# Patient Record
Sex: Male | Born: 1950
Health system: Southern US, Community
[De-identification: ages and names within clinical notes are randomized; demographics above are authoritative.]

## PROBLEM LIST (undated history)

## (undated) DIAGNOSIS — I10 Essential (primary) hypertension: Secondary | ICD-10-CM

## (undated) DIAGNOSIS — L4050 Arthropathic psoriasis, unspecified: Secondary | ICD-10-CM

## (undated) HISTORY — PX: TOTAL HIP ARTHROPLASTY: SHX124

## (undated) HISTORY — PX: HERNIA REPAIR: SHX51

## (undated) HISTORY — PX: APPENDECTOMY: SHX54

---

## 2013-12-16 LAB — HEPATIC FUNCTION PANEL
ALK PHOS: 42 U/L (ref 25–125)
ALT: 18 U/L (ref 10–40)

## 2013-12-16 LAB — CBC AND DIFFERENTIAL
Hemoglobin: 15.9 g/dL (ref 13.5–17.5)
PLATELETS: 211 10*3/uL (ref 150–399)
WBC: 8.9 10*3/mL

## 2013-12-16 LAB — BASIC METABOLIC PANEL
Creatinine: 0.9 mg/dL (ref 0.6–1.3)
GLUCOSE: 107 mg/dL
POTASSIUM: 4.7 mmol/L (ref 3.4–5.3)
SODIUM: 131 mmol/L — AB (ref 137–147)

## 2013-12-16 LAB — PSA: PSA: 0.3

## 2014-11-17 ENCOUNTER — Encounter: Payer: Self-pay | Admitting: *Deleted

## 2014-11-17 ENCOUNTER — Emergency Department
Admission: EM | Admit: 2014-11-17 | Discharge: 2014-11-17 | Disposition: A | Discharge status: Home / Self Care | Payer: BLUE CROSS/BLUE SHIELD | Source: Home / Self Care | Attending: Emergency Medicine | Admitting: Emergency Medicine

## 2014-11-17 ENCOUNTER — Emergency Department (INDEPENDENT_AMBULATORY_CARE_PROVIDER_SITE_OTHER): Payer: BLUE CROSS/BLUE SHIELD

## 2014-11-17 DIAGNOSIS — M25562 Pain in left knee: Secondary | ICD-10-CM | POA: Diagnosis not present

## 2014-11-17 DIAGNOSIS — I7389 Other specified peripheral vascular diseases: Secondary | ICD-10-CM | POA: Diagnosis not present

## 2014-11-17 DIAGNOSIS — R52 Pain, unspecified: Secondary | ICD-10-CM

## 2014-11-17 DIAGNOSIS — S8392XA Sprain of unspecified site of left knee, initial encounter: Secondary | ICD-10-CM

## 2014-11-17 HISTORY — DX: Essential (primary) hypertension: I10

## 2014-11-17 HISTORY — DX: Arthropathic psoriasis, unspecified: L40.50

## 2014-11-17 NOTE — ED Provider Notes (Signed)
CSN: [id number]     Arrival date & time 11/17/14  1003 History   First MD Initiated Contact with Patient 11/17/14 1021     Chief Complaint  Patient presents with  . Knee Pain   (Consider location/radiation/quality/duration/timing/severity/associated sxs/prior Treatment) Patient is a 64 y.o. male presenting with knee pain. The history is provided by the patient. No language interpreter was used.  Knee Pain Location:  Knee Time since incident:  3 days Pain details:    Quality:  Aching   Radiates to:  Does not radiate   Severity:  Moderate   Onset quality:  Gradual   Duration:  3 days   Timing:  Constant   Progression:  Worsening Chronicity:  New Prior injury to area:  Yes Relieved by:  Nothing Worsened by:  Nothing tried Ineffective treatments:  None tried Risk factors: no concern for non-accidental trauma   Pt twisted his knee going down a step 3 days ago.  Pt reports had pain in knee for a week before   Past Medical History  Diagnosis Date  . Hypertension   . Psoriatic arthritis    Past Surgical History  Procedure Laterality Date  . Total hip arthroplasty    . Appendectomy     History reviewed. No pertinent family history. History  Substance Use Topics  . Smoking status: Current Every Day Smoker -- 1.50 packs/day  . Smokeless tobacco: Not on file  . Alcohol Use: Yes    Review of Systems  All other systems reviewed and are negative.   Allergies  Methotrexate derivatives; Penicillins; and Zocor  Home Medications   Prior to Admission medications   Medication Sig Start Date End Date Taking? Authorizing Provider  amLODipine-benazepril (LOTREL) 5-10 MG per capsule Take 1 capsule by mouth daily.   Yes Historical Provider, MD  aspirin 81 MG tablet Take 81 mg by mouth daily.   Yes Historical Provider, MD  inFLIXimab (REMICADE) 100 MG injection Inject into the vein.   Yes Historical Provider, MD  metoprolol succinate (TOPROL-XL) 100 MG 24 hr tablet Take 100 mg by  mouth daily. Take with or immediately following a meal.   Yes Historical Provider, MD  probenecid (BENEMID) 500 MG tablet Take 500 mg by mouth 2 (two) times daily.   Yes Historical Provider, MD   BP 122/77 mmHg  Pulse 68  Temp(Src) 98.4 F (36.9 C) (Oral)  Resp 18  Ht  (1.854 m)  Wt 199 lb (90.266 kg)  BMI 26.26 kg/m2  SpO2 97% Physical Exam  Constitutional: He appears well-developed and well-nourished.  HENT:  Head: Normocephalic.  Right Ear: External ear normal.  Left Ear: External ear normal.  Nose: Nose normal.  Mouth/Throat: Oropharynx is clear and moist.  Musculoskeletal: He exhibits tenderness.  Tender knee from,  Pain with movement,  nv and ns intact no medial or lateral instability  Neurological: He is alert.  Skin: Skin is warm.    ED Course  Procedures (including critical care time) Labs Review Labs Reviewed - No data to display  Imaging Review Dg Knee 1-2 Views Left  11/17/2014   CLINICAL DATA:  64 year old male with medial left knee pain, swelling, superior patella pain x1 week. Initial encounter.  EXAM: LEFT KNEE - 1-2 VIEW  COMPARISON:  None.  FINDINGS: Extensive calcified arterial plaque in the visible left lower extremity. Bone mineralization is within normal limits. Small suprapatellar joint effusion. The patella appears intact. Joint spaces are within normal limits for age. No acute fracture or  dislocation.  IMPRESSION: 1. Suggestion of small suprapatellar joint effusion but no acute osseous abnormality identified at the left knee. 2. Extensive calcified peripheral vascular disease.   Electronically Signed   By: Augusto GambleLee  Hall M.D.   On: 11/17/2014 10:40     MDM   1. Knee sprain, left, initial encounter   2. Pain    avs See your Physician for recheck in 1 week Ibuprofen  Knee imbolizer   Elson AreasLeslie K Desha Bitner, PA-C 11/17/14 1201

## 2014-11-17 NOTE — Discharge Instructions (Signed)

## 2014-11-17 NOTE — ED Notes (Signed)
Pt c/o LT knee pain x 1 wk, worse x 3 days after twisting it. He has taken Advil for pain.

## 2015-02-08 ENCOUNTER — Ambulatory Visit (INDEPENDENT_AMBULATORY_CARE_PROVIDER_SITE_OTHER): Payer: BLUE CROSS/BLUE SHIELD | Admitting: Family Medicine

## 2015-02-08 ENCOUNTER — Encounter: Payer: Self-pay | Admitting: Family Medicine

## 2015-02-08 VITALS — BP 125/80 | HR 75 | Ht 73.0 in | Wt 196.0 lb

## 2015-02-08 DIAGNOSIS — I10 Essential (primary) hypertension: Secondary | ICD-10-CM

## 2015-02-08 DIAGNOSIS — E785 Hyperlipidemia, unspecified: Secondary | ICD-10-CM | POA: Diagnosis not present

## 2015-02-08 DIAGNOSIS — L4050 Arthropathic psoriasis, unspecified: Secondary | ICD-10-CM | POA: Diagnosis not present

## 2015-02-08 MED ORDER — AMLODIPINE BESY-BENAZEPRIL HCL 5-10 MG PO CAPS: 1.0000 | ORAL_CAPSULE | Freq: Every day | ORAL | Status: DC

## 2015-02-08 MED ORDER — FISH OIL 1000 MG PO CAPS: ORAL_CAPSULE | ORAL | Status: DC

## 2015-02-08 MED ORDER — METOPROLOL SUCCINATE ER 100 MG PO TB24: 100.0000 mg | ORAL_TABLET | Freq: Every day | ORAL | Status: DC

## 2015-02-08 NOTE — Progress Notes (Signed)
CC: Bryan PayorMark Benson is a 64 y.o. male is here for Establish Care and refill Bp med   Subjective: HPI:  Very pleasant 64 year old here to establish care  Reports ahistory of hypertension stemming back the past 12 years. For at least the past year he's been taking amlodipine/benazepril along with metoprolol on a daily basis. On chart review blood pressures have been stable with this regimen at other clinics. He denies any known cardiovascular disease. He is requesting refills on his medications. He denies no side effects from these medications. No other outside blood pressures to report  He reports a history of hyperlipidemia. Last lipid panel was 2014 with an LDL of 152.  He has tried both Lipitor and Zocor in the past that caused intolerable myalgias. He has never tried anything else to help with cholesterol. No formal exercise routine.  Reports a history of psoriatic arthritis. Many years ago he was started on probenecid due to concerns that he had gout. There was never any clear consensus on whether or not his pain was due to gout or psoriatic arthritis or he's been able to taper down on probenecid now taking only once a day. He wants to stop taking this entirely.   Review Of Systems Outlined In HPI  Past Medical History  Diagnosis Date  . Hypertension   . Psoriatic arthritis     Past Surgical History  Procedure Laterality Date  . Total hip arthroplasty  [id number]  . Appendectomy      age 89  . Hernia repair  [phone]   Family History  Problem Relation Age of Onset  . Stroke Father     History   Social History  . Marital Status: Married    Spouse Name: N/A  . Number of Children: N/A  . Years of Education: N/A   Occupational History  . Not on file.   Social History Main Topics  . Smoking status: Current Every Day Smoker -- 1.50 packs/day for 48 years  . Smokeless tobacco: Not on file  . Alcohol Use: 4.2 oz/week    7 Standard drinks or equivalent per week  . Drug Use:  No  . Sexual Activity:    Partners: Female   Other Topics Concern  . Not on file   Social History Narrative     Objective: BP 125/80 mmHg  Pulse 75  Ht 6\' 1"  (1.854 m)  Wt 196 lb (88.905 kg)  BMI 25.86 kg/m2  General: Alert and Oriented, No Acute Distress HEENT: Pupils equal, round, reactive to light. Conjunctivae clear.  Moist mucous membranes parents unremarkable Lungs: Clear to auscultation bilaterally, no wheezing/ronchi/rales.  Comfortable work of breathing. Good air movement. Cardiac: Regular rate and rhythm. Normal S1/S2.  No murmurs, rubs, nor gallops.  No carotid bruit Extremities: No peripheral edema.  Strong peripheral pulses.  Mental Status: No depression, anxiety, nor agitation. Skin: Warm and dry.  Assessment & Plan: Bryan Benson was seen today for establish care and refill bp med.  Diagnoses and all orders for this visit:  Essential hypertension  Hyperlipidemia  Psoriatic arthritis  Other orders -     amLODipine-benazepril (LOTREL) 5-10 MG per capsule; Take 1 capsule by mouth daily. -     metoprolol succinate (TOPROL-XL) 100 MG 24 hr tablet; Take 1 tablet (100 mg total) by mouth daily. Take with or immediately following a meal. -     Omega-3 Fatty Acids (FISH OIL) 1000 MG CAPS; 1 by mouth twice a day with meals   Essential hypertension:  Controlled continue amlodipine, benazepril and metoprolol.I've asked him to return at his convenience to have a fasting metabolic panel to check renal function and blood sugar Hyperlipidemia: Intolerant of statins, starting twice a day fish oil. Return in approximately 3 months to have cholesterol checked. Psoriatic arthritis: I've asked him to continue taking probenecid as previously prescribed by his rheumatologist, I more if he stopped taking probenecid under my guidance and if he had a gout flare it could cause permanent pain given his history of psoriatic arthritis.  We talked about getting a colonoscopy today, his father had  a perforation when he was scoped many years ago and the patient has reservations about getting a colonoscopy due to this risk. We discussed risks and benefits of this procedure and I would get it if I were him however he politely declines.   Return in about 3 months (around 05/11/2015) for CPE.

## 2015-03-04 ENCOUNTER — Encounter: Payer: Self-pay | Admitting: Family Medicine

## 2015-03-04 DIAGNOSIS — R809 Proteinuria, unspecified: Secondary | ICD-10-CM | POA: Insufficient documentation

## 2015-03-04 DIAGNOSIS — M109 Gout, unspecified: Secondary | ICD-10-CM | POA: Insufficient documentation

## 2015-03-16 ENCOUNTER — Encounter: Payer: Self-pay | Admitting: Family Medicine

## 2015-05-11 ENCOUNTER — Encounter: Payer: Self-pay | Admitting: Family Medicine

## 2015-05-11 ENCOUNTER — Ambulatory Visit (INDEPENDENT_AMBULATORY_CARE_PROVIDER_SITE_OTHER): Payer: BLUE CROSS/BLUE SHIELD | Admitting: Family Medicine

## 2015-05-11 VITALS — BP 134/85 | HR 64 | Ht 73.0 in | Wt 193.0 lb

## 2015-05-11 DIAGNOSIS — Z0000 Encounter for general adult medical examination without abnormal findings: Secondary | ICD-10-CM

## 2015-05-11 DIAGNOSIS — L989 Disorder of the skin and subcutaneous tissue, unspecified: Secondary | ICD-10-CM | POA: Diagnosis not present

## 2015-05-11 LAB — LIPID PANEL
Cholesterol: 295 mg/dL — ABNORMAL HIGH (ref 0–200)
HDL: 53 mg/dL (ref >=40)
LDL Cholesterol: 199 mg/dL — ABNORMAL HIGH (ref 0–99)
Total CHOL/HDL Ratio: 5.6 Ratio
Triglycerides: 216 mg/dL — ABNORMAL HIGH (ref <150)
VLDL: 43 mg/dL — ABNORMAL HIGH (ref 0–40)

## 2015-05-11 MED ORDER — METOPROLOL TARTRATE 100 MG PO TABS: 100.0000 mg | ORAL_TABLET | Freq: Every day | ORAL | Status: DC

## 2015-05-11 MED ORDER — AMLODIPINE BESY-BENAZEPRIL HCL 5-10 MG PO CAPS: 1.0000 | ORAL_CAPSULE | Freq: Every day | ORAL | Status: DC

## 2015-05-11 NOTE — Patient Instructions (Signed)
Dr. Faithlynn Deeley's General Advice Following Your Complete Physical Exam  The Benefits of Regular Exercise: Unless you suffer from an uncontrolled cardiovascular condition, studies strongly suggest that regular exercise and physical activity will add to both the quality and length of your life.  The World Health Organization recommends 150 minutes of moderate intensity aerobic activity every week.  This is best split over 3-4 days a week, and can be as simple as a brisk walk for just over 35 minutes "most days of the week".  This type of exercise has been shown to lower LDL-Cholesterol, lower average blood sugars, lower blood pressure, lower cardiovascular disease risk, improve memory, and increase one's overall sense of wellbeing.  The addition of anaerobic (or "strength training") exercises offers additional benefits including but not limited to increased metabolism, prevention of osteoporosis, and improved overall cholesterol levels.  How Can I Strive For A Low-Fat Diet?: Current guidelines recommend that 25-35 percent of your daily energy (food) intake should come from fats.  One might ask how can this be achieved without having to dissect each meal on a daily basis?  Switch to skim or 1% milk instead of whole milk.  Focus on lean meats such as ground turkey, fresh fish, baked chicken, and lean cuts of beef as your source of dietary protein.  Limit saturated fat consumption to less than 10% of your daily caloric intake.  Limit trans fatty acid consumption primarily by limiting synthetic trans fats such as partially hydrogenated oils (Ex: fried fast foods).  Substitute olive or vegetable oil for solid fats where possible.  Moderation of Salt Intake: Provided you don't carry a diagnosis of congestive heart failure nor renal failure, I recommend a daily allowance of no more than 2300 mg of salt (sodium).  Keeping under this daily goal is associated with a decreased risk of cardiovascular events, creeping  above it can lead to elevated blood pressures and increases your risk of cardiovascular events.  Milligrams (mg) of salt is listed on all nutrition labels, and your daily intake can add up faster than you think.  Most canned and frozen dinners can pack in over half your daily salt allowance in one meal.    Lifestyle Health Risks: Certain lifestyle choices carry specific health risks.  As you may already know, tobacco use has been associated with increasing one's risk of cardiovascular disease, pulmonary disease, numerous cancers, among many other issues.  What you may not know is that there are medications and nicotine replacement strategies that can more than double your chances of successfully quitting.  I would be thrilled to help manage your quitting strategy if you currently use tobacco products.  When it comes to alcohol use, I've yet to find an "ideal" daily allowance.  Provided an individual does not have a medical condition that is exacerbated by alcohol consumption, general guidelines determine "safe drinking" as no more than two standard drinks for a man or no more than one standard drink for a male per day.  However, much debate still exists on whether any amount of alcohol consumption is technically "safe".  My general advice, keep alcohol consumption to a minimum for general health promotion.  If you or others believe that alcohol, tobacco, or recreational drug use is interfering with your life, I would be happy to provide confidential counseling regarding treatment options.  General "Over The Counter" Nutrition Advice: Postmenopausal women should aim for a daily calcium intake of 1200 mg, however a significant portion of this might already be   provided by diets including milk, yogurt, cheese, and other dairy products.  Vitamin D has been shown to help preserve bone density, prevent fatigue, and has even been shown to help reduce falls in the elderly.  Ensuring a daily intake of 800 Units of  Vitamin D is a good place to start to enjoy the above benefits, we can easily check your Vitamin D level to see if you'd potentially benefit from supplementation beyond 800 Units a day.  Folic Acid intake should be of particular concern to women of childbearing age.  Daily consumption of 400-800 mcg of Folic Acid is recommended to minimize the chance of spinal cord defects in a fetus should pregnancy occur.    For many adults, accidents still remain one of the most common culprits when it comes to cause of death.  Some of the simplest but most effective preventitive habits you can adopt include regular seatbelt use, proper helmet use, securing firearms, and regularly testing your smoke and carbon monoxide detectors.  Norissa Bartee B. Tejay Hubert DO [facility] [address], [address] [place], [place] [address] Phone: [phone]  

## 2015-05-11 NOTE — Progress Notes (Signed)
CC: Bryan Benson is a 64 y.o. male is here for Annual Exam   Subjective: HPI:  Colonoscopy: Patient declines all screening for colon cancer Prostate: Discussed screening risks/beneifts with patient during today's visit, he would be okay with getting a PSA  Influenza Vaccine: No current indication Pneumovax: Declined Td/Tdap: UTD 2009 Zoster: Contraindicated given Remicade infusions  Presents for complete physical exam complete metabolic panel unremarkable this month and CBC unremarkable other than mild elevation of MCV earlier this month  Review of Systems - General ROS: negative for - chills, fever, night sweats, weight gain or weight loss Ophthalmic ROS: negative for - decreased vision Psychological ROS: negative for - anxiety or depression ENT ROS: negative for - hearing change, nasal congestion, tinnitus or allergies Hematological and Lymphatic ROS: negative for - bleeding problems, bruising or swollen lymph nodes Breast ROS: negative Respiratory ROS: no cough, shortness of breath, or wheezing Cardiovascular ROS: no chest pain or dyspnea on exertion Gastrointestinal ROS: no abdominal pain, change in bowel habits, or black or bloody stools Genito-Urinary ROS: negative for - genital discharge, genital ulcers, incontinence or abnormal bleeding from genitals Musculoskeletal ROS: negative for - joint pain or muscle pain Neurological ROS: negative for - headaches or memory loss Dermatological ROS: negative for lumps, mole changes, rash and skin lesion changes  Past Medical History  Diagnosis Date  . Hypertension   . Psoriatic arthritis     Past Surgical History  Procedure Laterality Date  . Total hip arthroplasty  0981,1914  . Appendectomy      age 47  . Hernia repair  [phone]   Family History  Problem Relation Age of Onset  . Stroke Father     History   Social History  . Marital Status: Married    Spouse Name: N/A  . Number of Children: N/A  . Years of Education:  N/A   Occupational History  . Not on file.   Social History Main Topics  . Smoking status: Current Every Day Smoker -- 1.50 packs/day for 48 years  . Smokeless tobacco: Not on file  . Alcohol Use: 4.2 oz/week    7 Standard drinks or equivalent per week  . Drug Use: No  . Sexual Activity:    Partners: Female   Other Topics Concern  . Not on file   Social History Narrative     Objective: BP 134/85 mmHg  Pulse 64  Ht  (1.854 m)  Wt 193 lb (87.544 kg)  BMI 25.47 kg/m2  General: No Acute Distress HEENT: Atraumatic, normocephalic, conjunctivae normal without scleral icterus.  No nasal discharge, hearing grossly intact, TMs with good landmarks bilaterally with no middle ear abnormalities, posterior pharynx clear without oral lesions. Neck: Supple, trachea midline, no cervical nor supraclavicular adenopathy. Pulmonary: Clear to auscultation bilaterally without wheezing, rhonchi, nor rales. Cardiac: Regular rate and rhythm.  No murmurs, rubs, nor gallops. No peripheral edema.  2+ peripheral pulses bilaterally. Abdomen: Bowel sounds normal.  No masses.  Non-tender without rebound.  Negative Murphy's sign. GU: Bilateral descended testes no inguinal hernia MSK: Grossly intact, no signs of weakness.  Full strength throughout upper and lower extremities.  Full ROM in upper and lower extremities.  No midline spinal tenderness. Neuro: Gait unremarkable, CN II-XII grossly intact.  C5-C6 Reflex 2/4 Bilaterally, L4 Reflex 2/4 Bilaterally.  Cerebellar function intact. Skin: No rashes. Pearly and flushing dome-shaped raised lesion on the right cheek Psych: Alert and oriented to person/place/time.  Thought process normal. No anxiety/depression.  Assessment &  Plan: Bryan LericheMark was seen today for annual exam.  Diagnoses and all orders for this visit:  Annual physical exam Orders: -     PSA -     Lipid panel  Facial lesion Orders: -     Ambulatory referral to Dermatology  Other orders -      amLODipine-benazepril (LOTREL) 5-10 MG per capsule; Take 1 capsule by mouth daily. -     metoprolol (LOPRESSOR) 100 MG tablet; Take 1 tablet (100 mg total) by mouth daily.   Healthy lifestyle interventions including but not limited to regular exercise, a healthy low fat diet, moderation of salt intake, the dangers of tobacco/alcohol/recreational drug use, nutrition supplementation, and accident avoidance were discussed with the patient and a handout was provided for future reference. Discussed my suspicion for basal cell carcinoma on the right cheek and referral to dermatology for further evaluation.  Return in about 6 months (around 11/10/2015) for Blood Pressure.

## 2015-05-12 ENCOUNTER — Telehealth: Payer: Self-pay | Admitting: Family Medicine

## 2015-05-12 DIAGNOSIS — E785 Hyperlipidemia, unspecified: Secondary | ICD-10-CM

## 2015-05-12 LAB — PSA: PSA: 0.34 ng/mL (ref <=4.00)

## 2015-05-12 NOTE — Telephone Encounter (Signed)
Sue Lushndrea, Will you please let patient know that his LDL cholesterol was two times above the upper limit of normal.  Since is has an intolerance to some of the statin class of cholesterol lowering medications I'd recommend trying an OTC formulation of red yeast rice at a dose of 1200mg  daily.  I'd also recommend he return in 3 months to have cholesterol rechecked.  Fortunately the PSA prostate test was normal.

## 2015-05-12 NOTE — Telephone Encounter (Signed)
Left message on vm

## 2015-06-15 ENCOUNTER — Other Ambulatory Visit: Payer: Self-pay | Admitting: *Deleted

## 2015-06-15 MED ORDER — METOPROLOL SUCCINATE ER 100 MG PO TB24: 100.0000 mg | ORAL_TABLET | Freq: Every day | ORAL | Status: DC

## 2015-08-11 ENCOUNTER — Ambulatory Visit (INDEPENDENT_AMBULATORY_CARE_PROVIDER_SITE_OTHER): Payer: BLUE CROSS/BLUE SHIELD | Admitting: Family Medicine

## 2015-08-11 ENCOUNTER — Encounter: Payer: Self-pay | Admitting: Family Medicine

## 2015-08-11 VITALS — BP 130/77 | HR 74 | Wt 200.0 lb

## 2015-08-11 DIAGNOSIS — E785 Hyperlipidemia, unspecified: Secondary | ICD-10-CM

## 2015-08-11 DIAGNOSIS — I10 Essential (primary) hypertension: Secondary | ICD-10-CM

## 2015-08-11 LAB — LIPID PANEL
CHOL/HDL RATIO: 6 ratio — AB (ref <=5.0)
Cholesterol: 270 mg/dL — ABNORMAL HIGH (ref 125–200)
HDL: 45 mg/dL (ref >=40)
LDL Cholesterol: 187 mg/dL — ABNORMAL HIGH (ref <130)
Triglycerides: 189 mg/dL — ABNORMAL HIGH (ref <150)
VLDL: 38 mg/dL — AB (ref <30)

## 2015-08-11 NOTE — Progress Notes (Signed)
CC: Bryan Benson is a 64 y.o. male is here for f/u chlesterol   Subjective: HPI:  Hyper lipidemia: Continues to take red yeast rice on a daily basis. No known side effects. Denies any myalgias right upper quadrant pain or joint pain. He's been intolerant of all other statins.  Follow-up essential hypertension: He is actually not taking metoprolol tartrate. He is taking metoprolol succinate on a daily basis with no outside blood pressures to report. He denies chest pain shortness of breath orthopnea nor peripheral edema.  Additionally is taking Lotrel as prescribed.    Review Of Systems Outlined In HPI  Past Medical History  Diagnosis Date  . Hypertension   . Psoriatic arthritis     Past Surgical History  Procedure Laterality Date  . Total hip arthroplasty  1610,9604  . Appendectomy      age 71  . Hernia repair  [phone]   Family History  Problem Relation Age of Onset  . Stroke Father     Social History   Social History  . Marital Status: Married    Spouse Name: N/A  . Number of Children: N/A  . Years of Education: N/A   Occupational History  . Not on file.   Social History Main Topics  . Smoking status: Current Every Day Smoker -- 1.50 packs/day for 48 years  . Smokeless tobacco: Not on file  . Alcohol Use: 4.2 oz/week    7 Standard drinks or equivalent per week  . Drug Use: No  . Sexual Activity:    Partners: Female   Other Topics Concern  . Not on file   Social History Narrative     Objective: BP 130/77 mmHg  Pulse 74  Wt 200 lb (90.719 kg)  General: Alert and Oriented, No Acute Distress HEENT: Pupils equal, round, reactive to light. Conjunctivae clear.  Moist mucous membranes Lungs: Clear to auscultation bilaterally, no wheezing/ronchi/rales.  Comfortable work of breathing. Good air movement. Cardiac: Regular rate and rhythm. Normal S1/S2.  No murmurs, rubs, nor gallops.   Abdomen: Mild obesity Extremities: No peripheral edema.  Strong peripheral  pulses.  Mental Status: No depression, anxiety, nor agitation. Skin: Warm and dry.  Assessment & Plan: Spero was seen today for f/u chlesterol.  Diagnoses and all orders for this visit:  Essential hypertension  Hyperlipidemia -     Lipid panel    essential hypertension: Controlled continue Lotrel and metoprolol   hyperlipidemia: Tolerant of red yeast rice, continue to take daily pending lipid panel today.  Declines flu shot  Return if symptoms worsen or fail to improve.

## 2015-08-12 ENCOUNTER — Telehealth: Payer: Self-pay | Admitting: Family Medicine

## 2015-08-12 MED ORDER — PITAVASTATIN CALCIUM 2 MG PO TABS: 2.0000 mg | ORAL_TABLET | Freq: Every day | ORAL | Status: DC

## 2015-08-12 NOTE — Telephone Encounter (Signed)
Sue Lush, Will you please let patient know that his LDL cholesterol has only improved by a few points and still remains significantly elevated.  There is a new type of pharmaceutical grade cholesterol medication on the market known as Livalo.  It's metabolized different than the other types he's tried and is extremely less likely to cause side effects.  I'd recommend he give this a try but first swing by the office to pick up a savings voucher from the triage room. Rx was sent to [facility].  If tolerated please return in three months to recheck cholesterol.

## 2015-08-12 NOTE — Telephone Encounter (Signed)
Message left on cellvm with results

## 2015-09-15 ENCOUNTER — Telehealth: Payer: Self-pay | Admitting: Family Medicine

## 2015-09-15 NOTE — Telephone Encounter (Signed)
Received fax for prior authorization on Livalo 2mg  sent through cover my meds waiting on authorization. - CF

## 2015-09-24 NOTE — Telephone Encounter (Signed)
Medication was approved. - CF

## 2015-11-02 ENCOUNTER — Encounter: Payer: Self-pay | Admitting: Family Medicine

## 2015-11-02 ENCOUNTER — Ambulatory Visit (INDEPENDENT_AMBULATORY_CARE_PROVIDER_SITE_OTHER): Payer: BLUE CROSS/BLUE SHIELD | Admitting: Family Medicine

## 2015-11-02 VITALS — BP 140/81 | HR 63 | Wt 196.0 lb

## 2015-11-02 DIAGNOSIS — E785 Hyperlipidemia, unspecified: Secondary | ICD-10-CM

## 2015-11-02 DIAGNOSIS — I10 Essential (primary) hypertension: Secondary | ICD-10-CM

## 2015-11-02 MED ORDER — METOPROLOL SUCCINATE ER 100 MG PO TB24: 100.0000 mg | ORAL_TABLET | Freq: Every day | ORAL | Status: DC

## 2015-11-02 MED ORDER — AMLODIPINE BESY-BENAZEPRIL HCL 5-10 MG PO CAPS
1.0000 | ORAL_CAPSULE | Freq: Every day | ORAL | Status: DC
Start: 2015-11-02 — End: 2016-06-05

## 2015-11-02 MED ORDER — PITAVASTATIN CALCIUM 2 MG PO TABS: 2.0000 mg | ORAL_TABLET | Freq: Every day | ORAL | Status: DC

## 2015-11-02 NOTE — Progress Notes (Signed)
CC: Rubin PayorMark Hiltz is a 64 y.o. male is here for Hypertension and Hyperlipidemia   Subjective: HPI:  Livalo needs another PA, since starting this medication he denies any known side effects.  Denies RUQ pain nor myalgias.  No chest pain or limb claudication.   F/U HTN; continues on metoprolol and Lotrel.  No outside BPs to report.  No chest pain, SOB, orthopnea, nor peripheral edema.     Review Of Systems Outlined In HPI  Past Medical History  Diagnosis Date  . Hypertension   . Psoriatic arthritis     Past Surgical History  Procedure Laterality Date  . Total hip arthroplasty  [id number],2006  . Appendectomy      age 89  . Hernia repair  [phone]   Family History  Problem Relation Age of Onset  . Stroke Father     Social History   Social History  . Marital Status: Married    Spouse Name: N/A  . Number of Children: N/A  . Years of Education: N/A   Occupational History  . Not on file.   Social History Main Topics  . Smoking status: Current Every Day Smoker -- 1.50 packs/day for 48 years  . Smokeless tobacco: Not on file  . Alcohol Use: 4.2 oz/week    7 Standard drinks or equivalent per week  . Drug Use: No  . Sexual Activity:    Partners: Female   Other Topics Concern  . Not on file   Social History Narrative     Objective: BP 140/81 mmHg  Pulse 63  Wt 196 lb (88.905 kg)  General: Alert and Oriented, No Acute Distress HEENT: Pupils equal, round, reactive to light. Conjunctivae clear.  Moist mucous membranes Lungs: Clear to auscultation bilaterally, no wheezing/ronchi/rales.  Comfortable work of breathing. Good air movement. Cardiac: Regular rate and rhythm. Normal S1/S2.  No murmurs, rubs, nor gallops.   Extremities: No peripheral edema.  Strong peripheral pulses.  Mental Status: No depression, anxiety, nor agitation. Skin: Warm and dry.  Assessment & Plan: [name] was seen today for hypertension and hyperlipidemia.  Diagnoses and all orders for this  visit:  Essential hypertension -     amLODipine-benazepril (LOTREL) 5-10 MG capsule; Take 1 capsule by mouth daily. -     metoprolol succinate (TOPROL-XL) 100 MG 24 hr tablet; Take 1 tablet (100 mg total) by mouth daily. Take with or immediately following a meal.  Hyperlipidemia -     Lipid panel  Other orders -     Pitavastatin Calcium (LIVALO) 2 MG TABS; Take 1 tablet (2 mg total) by mouth daily.   Essential hypertension: Controlled continue Lotrel and metoprolol Hyperlipidemia: Clinically controlled and tolerating livalo, a new prior authorization will be submitted, continue 2 mg daily pending lipid panel that was ordered today.  Return in about 3 months (around 01/31/2016).

## 2015-11-05 ENCOUNTER — Encounter: Payer: Self-pay | Admitting: Family Medicine

## 2015-11-05 LAB — LIPID PANEL
CHOL/HDL RATIO: 3.6 ratio (ref <=5.0)
CHOLESTEROL: 192 mg/dL (ref 125–200)
HDL: 54 mg/dL (ref >=40)
LDL CALC: 108 mg/dL (ref <130)
Triglycerides: 151 mg/dL — ABNORMAL HIGH (ref <150)
VLDL: 30 mg/dL (ref <30)

## 2015-11-10 ENCOUNTER — Ambulatory Visit: Payer: BLUE CROSS/BLUE SHIELD | Admitting: Family Medicine

## 2015-12-07 ENCOUNTER — Telehealth: Payer: Self-pay

## 2015-12-07 NOTE — Telephone Encounter (Signed)
Bryan Benson called and asked if he needs antibiotic before dental procedure because he has had a hip replacement. Please advise.

## 2015-12-08 NOTE — Telephone Encounter (Signed)
Not that I'm aware of given how far out his hip replacement was.

## 2015-12-10 NOTE — Telephone Encounter (Signed)
vm left for pt to return call

## 2016-02-16 ENCOUNTER — Ambulatory Visit (INDEPENDENT_AMBULATORY_CARE_PROVIDER_SITE_OTHER): Payer: BLUE CROSS/BLUE SHIELD | Admitting: Family Medicine

## 2016-02-16 ENCOUNTER — Encounter: Payer: Self-pay | Admitting: Family Medicine

## 2016-02-16 ENCOUNTER — Other Ambulatory Visit: Payer: Self-pay | Admitting: Family Medicine

## 2016-02-16 VITALS — BP 121/78 | HR 60 | Wt 196.0 lb

## 2016-02-16 DIAGNOSIS — I10 Essential (primary) hypertension: Secondary | ICD-10-CM | POA: Diagnosis not present

## 2016-02-16 DIAGNOSIS — E785 Hyperlipidemia, unspecified: Secondary | ICD-10-CM | POA: Diagnosis not present

## 2016-02-16 LAB — LDL CHOLESTEROL, DIRECT: LDL DIRECT: 110 mg/dL (ref <130)

## 2016-02-16 MED ORDER — PITAVASTATIN CALCIUM 2 MG PO TABS: 2.0000 mg | ORAL_TABLET | Freq: Every day | ORAL | Status: DC

## 2016-02-16 NOTE — Progress Notes (Signed)
CC: Bryan Benson is a 65 y.o. male is here for Hyperlipidemia   Subjective: HPI:  Follow-up essential hypertension: Taking metoprolol on a daily basis along with Lotrel. blood pressures reported. denies chest pain shortness of breath orthopnea nor peripheral edema.  Follow hyperlipidemia: Since starting livalo he's not had any side effects and actually gets his medication for free through his insurance. He denies any right upper quadrant pain or myalgias. Cholesterol was checked 3 months ago and was normal. He's tried his best to eat as healthy as he can. Denies leg claudication   Review Of Systems Outlined In HPI  Past Medical History  Diagnosis Date  . Hypertension   . Psoriatic arthritis [facility](HCC)     Past Surgical History  Procedure Laterality Date  . Total hip arthroplasty  [id number],2006  . Appendectomy      age 89  . Hernia repair  [phone],2004   Family History  Problem Relation Age of Onset  . Stroke Father     Social History   Social History  . Marital Status: Married    Spouse Name: N/A  . Number of Children: N/A  . Years of Education: N/A   Occupational History  . Not on file.   Social History Main Topics  . Smoking status: Current Every Day Smoker -- 1.50 packs/day for 48 years  . Smokeless tobacco: Not on file  . Alcohol Use: 4.2 oz/week    7 Standard drinks or equivalent per week  . Drug Use: No  . Sexual Activity:    Partners: Female   Other Topics Concern  . Not on file   Social History Narrative     Objective: BP 121/78 mmHg  Pulse 60  Wt 196 lb (88.905 kg)  General: Alert and Oriented, No Acute Distress HEENT: Pupils equal, round, reactive to light. Conjunctivae clear.  Moist nevus membranes Lungs: Clear to auscultation bilaterally, no wheezing/ronchi/rales.  Comfortable work of breathing. Good air movement. Cardiac: Regular rate and rhythm. Normal S1/S2.  No murmurs, rubs, nor gallops.   Extremities: No peripheral edema.  Strong peripheral  pulses.  Mental Status: No depression, anxiety, nor agitation. Skin: Warm and dry.  Assessment & Plan: [name] was seen today for hyperlipidemia.  Diagnoses and all orders for this visit:  Essential hypertension  Hyperlipidemia -     Pitavastatin Calcium (LIVALO) 2 MG TABS; Take 1 tablet (2 mg total) by mouth daily. -     Direct LDL   Essential hypertension: Controlled continue metoprolol and Lotrel Hyperlipidemia: Repeat LDL today and if normal he can follow-up in 6 months.  25 minutes spent face-to-face during visit today of which at least 50% was counseling or coordinating care regarding: 1. Essential hypertension   2. Hyperlipidemia      Return in about 6 months (around 08/17/2016) for BP.

## 2016-06-05 ENCOUNTER — Encounter: Payer: Self-pay | Admitting: Family Medicine

## 2016-06-05 ENCOUNTER — Ambulatory Visit (INDEPENDENT_AMBULATORY_CARE_PROVIDER_SITE_OTHER): Payer: BLUE CROSS/BLUE SHIELD | Admitting: Family Medicine

## 2016-06-05 VITALS — BP 127/85 | HR 93 | Wt 193.0 lb

## 2016-06-05 DIAGNOSIS — I10 Essential (primary) hypertension: Secondary | ICD-10-CM | POA: Diagnosis not present

## 2016-06-05 DIAGNOSIS — M109 Gout, unspecified: Secondary | ICD-10-CM

## 2016-06-05 DIAGNOSIS — E785 Hyperlipidemia, unspecified: Secondary | ICD-10-CM | POA: Diagnosis not present

## 2016-06-05 MED ORDER — PITAVASTATIN CALCIUM 2 MG PO TABS: 2.0000 mg | ORAL_TABLET | Freq: Every day | ORAL | 1 refills | Status: DC

## 2016-06-05 MED ORDER — AMLODIPINE BESY-BENAZEPRIL HCL 5-10 MG PO CAPS: 1.0000 | ORAL_CAPSULE | Freq: Every day | ORAL | 1 refills | Status: DC

## 2016-06-05 MED ORDER — METOPROLOL SUCCINATE ER 100 MG PO TB24: 100.0000 mg | ORAL_TABLET | Freq: Every day | ORAL | 1 refills | Status: DC

## 2016-06-05 NOTE — Progress Notes (Signed)
CC: Bryan Benson is a 65 y.o. male is here for Hypertension and Medication Refill (pt is wanting paper Rx)   Subjective: HPI:  Follow-up essential hypertension: Taking Lotrel and metoprolol with no chest pain shortness of breath orthopnea nor peripheral edema. He gets an infusion first psoriatic arthritis every 8 weeks and tells me that when his blood pressures checked at the infusion center it is always normotensive    follow-up hyperlipidemia: He still gets livalo for free. He denies right upper quadrant pain or myalgias. No chest pain or limb claudication.  Follow-up Gout: He tells me it's been greater than 1 year since his last flareup. He is currently managing this with dietary restrictions. Denies any joint pain or swelling.   Review Of Systems Outlined In HPI  Past Medical History:  Diagnosis Date  . Hypertension   . Psoriatic arthritis [facility])     Past Surgical History:  Procedure Laterality Date  . APPENDECTOMY     age 34  . HERNIA REPAIR  [id number]  . TOTAL HIP ARTHROPLASTY  [id number]   Family History  Problem Relation Age of Onset  . Stroke Father     Social History   Social History  . Marital status: Married    Spouse name: N/A  . Number of children: N/A  . Years of education: N/A   Occupational History  . Not on file.   Social History Main Topics  . Smoking status: Current Every Day Smoker    Packs/day: 1.50    Years: 48.00  . Smokeless tobacco: Not on file  . Alcohol use 4.2 oz/week    7 Standard drinks or equivalent per week  . Drug use: No  . Sexual activity: Yes    Partners: Female   Other Topics Concern  . Not on file   Social History Narrative  . No narrative on file     Objective: BP 127/85 (BP Location: Left Arm, Patient Position: Sitting, Cuff Size: Normal)   Pulse 93   Wt 193 lb (87.5 kg)   BMI 25.46 kg/m   General: Alert and Oriented, No Acute Distress HEENT: Pupils equal, round, reactive to light. Conjunctivae clear. Moist.  His membranes Lungs: Clear to auscultation bilaterally, no wheezing/ronchi/rales.  Comfortable work of breathing. Good air movement. Cardiac: Regular rate and rhythm. Normal S1/S2.  No murmurs, rubs, nor gallops.   Extremities: No peripheral edema.  Strong peripheral pulses.  Mental Status: No depression, anxiety, nor agitation. Skin: Warm and dry.  Assessment & Plan: Bryan Benson was seen today for hypertension and medication refill.  Diagnoses and all orders for this visit:  Gout without tophus, unspecified cause, unspecified chronicity, unspecified site  Essential hypertension -     amLODipine-benazepril (LOTREL) 5-10 MG capsule; Take 1 capsule by mouth daily. -     metoprolol succinate (TOPROL-XL) 100 MG 24 hr tablet; Take 1 tablet (100 mg total) by mouth daily. Take with or immediately following a meal.  Hyperlipidemia -     Pitavastatin Calcium (LIVALO) 2 MG TABS; Take 1 tablet (2 mg total) by mouth daily.   Gout: Asymptomatic, controlled with dietary restrictions. Essential hypertension:  Controlled with Lotrel and metoprolol   hyperlipidemia: Controlled with Livalo, he'll be due for repeat lipid panel in 3-6 months.  Discussed with this patient that I will be resigning from my position here with [facility] in September in order to stay with my family who will be moving to [facility]. I let him know about the providers  that are still accepting patients and I feel that this individual will be under great care if he/she stays here with [facility].   Return in about 6 months (around 12/06/2016) for BP and Cholesterol Check.

## 2016-07-04 ENCOUNTER — Emergency Department
Admission: EM | Admit: 2016-07-04 | Discharge: 2016-07-04 | Disposition: A | Discharge status: Home / Self Care | Payer: BLUE CROSS/BLUE SHIELD | Source: Home / Self Care | Attending: Family Medicine | Admitting: Family Medicine

## 2016-07-04 ENCOUNTER — Emergency Department (INDEPENDENT_AMBULATORY_CARE_PROVIDER_SITE_OTHER): Payer: BLUE CROSS/BLUE SHIELD

## 2016-07-04 ENCOUNTER — Encounter: Payer: Self-pay | Admitting: *Deleted

## 2016-07-04 DIAGNOSIS — W19XXXA Unspecified fall, initial encounter: Secondary | ICD-10-CM

## 2016-07-04 DIAGNOSIS — M25551 Pain in right hip: Secondary | ICD-10-CM

## 2016-07-04 DIAGNOSIS — M25552 Pain in left hip: Secondary | ICD-10-CM | POA: Diagnosis not present

## 2016-07-04 DIAGNOSIS — Z96643 Presence of artificial hip joint, bilateral: Secondary | ICD-10-CM | POA: Diagnosis not present

## 2016-07-04 NOTE — ED Triage Notes (Signed)
Pt c/o RT hip soreness post fall at home last night. He reports hx of RT hip replacement. He took Extra strength Tylenol at 0600 today.

## 2016-07-04 NOTE — ED Provider Notes (Signed)
CSN: [id number]     Arrival date & time 07/04/16  0945 History   First MD Initiated Contact with Patient 07/04/16 1000     Chief Complaint  Patient presents with  . Hip Pain   (Consider location/radiation/quality/duration/timing/severity/associated sxs/prior Treatment) HPI Bryan Benson is a 65 y.o. male presenting to UC with wife with c/o Right hip/buttock pain after tripping and falling last night in his garage, landing on his Right hip.  He has had soreness since then.  Pain is 2/10 at this time after taking Extra Strength Tylenol around 6AM this morning.  He reports having bilateral hip replacement in 2005 and 2006.  He does not typically use a walker or cane to ambulate but started using a walker he had at home today to help ambulate due to the pain.  Denies radiation of pain or numbness down Right leg. Denies neck or back pain. No LOC. He is not on blood thinners.    Past Medical History:  Diagnosis Date  . Hypertension   . Psoriatic arthritis [facility](HCC)    Past Surgical History:  Procedure Laterality Date  . APPENDECTOMY     age 89  . HERNIA REPAIR  [id number],2004  . TOTAL HIP ARTHROPLASTY  [id number],2006   Family History  Problem Relation Age of Onset  . Stroke Father    Social History  Substance Use Topics  . Smoking status: Current Every Day Smoker    Packs/day: 2.00    Years: 48.00  . Smokeless tobacco: Never Used  . Alcohol use 4.2 oz/week    7 Standard drinks or equivalent per week    Review of Systems  Cardiovascular: Negative for chest pain and palpitations.  Musculoskeletal: Positive for arthralgias, gait problem and myalgias. Negative for back pain, joint swelling, neck pain and neck stiffness.  Skin: Negative for color change, rash and wound.  Neurological: Negative for dizziness, syncope, weakness and numbness.    Allergies  Lipitor [atorvastatin]; Methotrexate derivatives; Penicillins; and Zocor [simvastatin]  Home Medications   Prior to Admission medications     Medication Sig Start Date End Date Taking? Authorizing Provider  UNABLE TO FIND Med Name: levolor 2mg  QD   Yes Historical Provider, MD  amLODipine-benazepril (LOTREL) 5-10 MG capsule Take 1 capsule by mouth daily. 06/05/16   Laren BoomSean Hommel, DO  aspirin 81 MG tablet Take 81 mg by mouth daily.    Historical Provider, MD  metoprolol succinate (TOPROL-XL) 100 MG 24 hr tablet Take 1 tablet (100 mg total) by mouth daily. Take with or immediately following a meal. 06/05/16   Laren BoomSean Hommel, DO  Pitavastatin Calcium (LIVALO) 2 MG TABS Take 1 tablet (2 mg total) by mouth daily. 06/05/16   Laren BoomSean Hommel, DO   Meds Ordered and Administered this Visit  Medications - No data to display  BP 115/73 (BP Location: Left Arm)   Pulse 83   Temp 98.2 F (36.8 C) (Oral)   Resp 16   Ht 6' (1.829 m)   Wt 190 lb (86.2 kg)   SpO2 99%   BMI 25.77 kg/m  No data found.   Physical Exam  Constitutional: He is oriented to person, place, and time. He appears well-developed and well-nourished. No distress.  Pt sitting in exam chair, walker by his side, NAD  HENT:  Head: Normocephalic and atraumatic.  Neck: Normal range of motion. Neck supple.  Cardiovascular: Normal rate.   Pulmonary/Chest: Effort normal.  Musculoskeletal: Normal range of motion. He exhibits tenderness. He exhibits no edema or deformity.  Right hip: He exhibits tenderness.       Legs: Right hip: tenderness to Right buttock, near Right hip. No tenderness along lateral thigh. No direct tenderness at hip joint. No crepitus.  Neurological: He is alert and oriented to person, place, and time.  Antalgic gait, uses walker to ambulate   Skin: Skin is warm and dry. He is not diaphoretic.  Psychiatric: He has a normal mood and affect. His behavior is normal.  Nursing note and vitals reviewed.   Urgent Care Course   Clinical Course    Procedures (including critical care time)  Labs Review Labs Reviewed - No data to display  Imaging Review Dg Hip  Unilat W Or Wo Pelvis 2-3 Views Right  Result Date: 07/04/2016 CLINICAL DATA:  Right hip pain after fall onto garage floor last night. Bilateral hip replacements. EXAM: DG HIP (WITH OR WITHOUT PELVIS) 2-3V RIGHT COMPARISON:  None. FINDINGS: Bilateral hip replacements noted. Normal AP alignment. No hardware complicating feature. No acute fracture, subluxation or dislocation. IMPRESSION: Bilateral hip replacements.  No acute bony abnormality. Electronically Signed   By: Charlett NoseKevin  Dover M.D.   On: 07/04/2016 10:31      MDM   1. Right hip pain   2. Fall, initial encounter    Right hip pain after trip and fall at home. No other injuries. No red flag symptoms.  Plain films: negative for acute findings. Reassured pt. Likely contusion. Encouraged alternating cool and warm compresses for comfort. May continue taking his aspirin and tylenol for pain. F/u with PCP or Sports Medicine in 1-2 weeks if not improving, sooner if worsening. Patient verbalized understanding and agreement with treatment plan.     Junius Finnerrin O'Malley, PA-C 07/04/16 1119

## 2016-07-31 ENCOUNTER — Encounter: Payer: Self-pay | Admitting: Osteopathic Medicine

## 2016-07-31 ENCOUNTER — Ambulatory Visit (INDEPENDENT_AMBULATORY_CARE_PROVIDER_SITE_OTHER): Payer: BLUE CROSS/BLUE SHIELD | Admitting: Osteopathic Medicine

## 2016-07-31 VITALS — BP 117/70 | HR 99 | Ht 72.0 in | Wt 189.0 lb

## 2016-07-31 DIAGNOSIS — R229 Localized swelling, mass and lump, unspecified: Secondary | ICD-10-CM | POA: Diagnosis not present

## 2016-07-31 NOTE — Patient Instructions (Signed)
Plan:  Ultrasound for further characterization of this mass.  If pain, fever, drainage from the wound, or other concerns, see me ASAP. If mass requires surgical consult, will let you know. Call us with any questions!

## 2016-07-31 NOTE — Progress Notes (Signed)
HPI: Bryan Benson is a 65 y.o. male  who [facility] today, 07/31/16,  for chief complaint of:  Chief Complaint  Patient presents with  . Cyst    lower back    Cyst/lump on lower back has been there for over 20 years. He recently fell a few weeks ago, treaetd in urgent care 07/04/16, and injured this area, mass on the buttock started draining some thick, nasty smelling material, this seems to have stopped. No fever or chills, mass is nontender, skin appears a bit bruised over the area but no extension of erythema.    Past medical, surgical, social and family history reviewed: Past Medical History:  Diagnosis Date  . Hypertension   . Psoriatic arthritis [facility](HCC)    Past Surgical History:  Procedure Laterality Date  . APPENDECTOMY     age 89  . HERNIA REPAIR  [id number],2004  . TOTAL HIP ARTHROPLASTY  [id number],2006   Social History  Substance Use Topics  . Smoking status: Current Every Day Smoker    Packs/day: 2.00    Years: 48.00  . Smokeless tobacco: Never Used  . Alcohol use 4.2 oz/week    7 Standard drinks or equivalent per week   Family History  Problem Relation Age of Onset  . Stroke Father      Current medication list and allergy/intolerance information reviewed:   Current Outpatient Prescriptions  Medication Sig Dispense Refill  . amLODipine-benazepril (LOTREL) 5-10 MG capsule Take 1 capsule by mouth daily. 90 capsule 1  . aspirin 81 MG tablet Take 81 mg by mouth daily.    . metoprolol succinate (TOPROL-XL) 100 MG 24 hr tablet Take 1 tablet (100 mg total) by mouth daily. Take with or immediately following a meal. 90 tablet 1  . Pitavastatin Calcium (LIVALO) 2 MG TABS Take 1 tablet (2 mg total) by mouth daily. 90 tablet 1  . UNABLE TO FIND Med Name: levolor 2mg  QD    . inFLIXimab (REMICADE) 100 MG injection Inject 450 mg into the vein every 8 (eight) weeks.     No current facility-administered medications for this visit.     Allergies  Allergen Reactions  . Lipitor [Atorvastatin]   . Methotrexate Derivatives   . Penicillins   . Zocor [Simvastatin]       Review of Systems:  Constitutional:  No  fever, no chills, No recent illness, No unintentional weight changes. No significant fatigue.   HEENT: No  headache, no vision change  Cardiac: No  chest pain, No  pressure  Respiratory:  No  shortness of breath  Skin: No  Rash, +other wounds/concerning lesions as per HPI  Hem/Onc: No  easy bruising/bleeding, No  abnormal lymph node  Neurologic: No  weakness, No  dizziness  Exam:  BP 117/70   Pulse 99   Ht 6' (1.829 m)   Wt 189 lb (85.7 kg)   BMI 25.63 kg/m   Constitutional: VS see above. General Appearance: alert, well-developed, well-nourished, NAD  Ears, Nose, Mouth, Throat: MMM, Normal external inspection ears/nares/mouth/lips/gums.   Neck: No masses, trachea midline.  Respiratory: Normal respiratory effort.  Skin: warm, dry. Indurated mass above the right buttock, small ulceration, appears to be healing, see photograph below. Mass is nontender, oblong and irregular rather than they're call.  Psychiatric: Normal judgment/insight. Normal mood and affect. Oriented x3.            ASSESSMENT/PLAN:   Ultrasound for further characterization, suspect this is a area large  sebaceous cyst, which seems reasonable based on the patient's description of the material which drained the lack of concerning infective symptoms such as pain or redness. Has been present many years without change in size or character, I have low suspicion for malignant process though lipoma is in the differential  Since not causing any pain and appears to be healing okay where it was injured, gave patient the option to either refer to general surgery for opinion on removal, versus ultrasound for further characterization. Ultrasound reasonable. We probably could try evacuation of this in the office however I feel it would  be the large sinus and this may be at her dull with by general surgeon or possibly dermatology/plastics.    Subcutaneous mass - Plan: [place] Misc Soft Tissue     Visit summary with medication list and pertinent instructions was printed for patient to review. All questions at time of visit were answered - patient instructed to contact office with any additional concerns. ER/RTC precautions were reviewed with the patient. Follow-up plan: Return in about 3 months (around 10/30/2016), or sooner if needed / based on ultrasound results, for annual physical with labs.

## 2016-08-04 ENCOUNTER — Ambulatory Visit (INDEPENDENT_AMBULATORY_CARE_PROVIDER_SITE_OTHER): Payer: BLUE CROSS/BLUE SHIELD

## 2016-08-04 DIAGNOSIS — R229 Localized swelling, mass and lump, unspecified: Secondary | ICD-10-CM | POA: Diagnosis not present

## 2016-08-07 ENCOUNTER — Ambulatory Visit (INDEPENDENT_AMBULATORY_CARE_PROVIDER_SITE_OTHER): Payer: BLUE CROSS/BLUE SHIELD | Admitting: Osteopathic Medicine

## 2016-08-07 ENCOUNTER — Encounter: Payer: Self-pay | Admitting: Osteopathic Medicine

## 2016-08-07 VITALS — BP 117/70 | HR 73 | Ht 72.0 in | Wt 187.0 lb

## 2016-08-07 DIAGNOSIS — R229 Localized swelling, mass and lump, unspecified: Secondary | ICD-10-CM | POA: Diagnosis not present

## 2016-08-07 NOTE — Progress Notes (Signed)
HPI: Bryan Benson is a 65 y.o. male  who presents [facility] today, 08/07/16,  for chief complaint of:  Chief Complaint  Patient presents with  . Follow-up    ULTRASOUND RESULTS     Subcutaneous mass superior to the left buttock. Patient previously came to see me for this after it had started draining some foul-smelling material after trauma. Recommended at that time ultrasound for further evaluation, ultrasound results as below. Patient is here to go over the images and discussed the results. Lesion he states has been present for 25 years without causing him any problems until he fell/hit this. He is not terribly worried about it.  Past medical, surgical, social and family history reviewed: Past Medical History:  Diagnosis Date  . Hypertension   . Psoriatic arthritis [facility](HCC)    Past Surgical History:  Procedure Laterality Date  . APPENDECTOMY     age 89  . HERNIA REPAIR  [id number],2004  . TOTAL HIP ARTHROPLASTY  [id number],2006   Social History  Substance Use Topics  . Smoking status: Current Every Day Smoker    Packs/day: 2.00    Years: 48.00  . Smokeless tobacco: Never Used  . Alcohol use 4.2 oz/week    7 Standard drinks or equivalent per week   Family History  Problem Relation Age of Onset  . Stroke Father      Current medication list and allergy/intolerance information reviewed:   Current Outpatient Prescriptions  Medication Sig Dispense Refill  . amLODipine-benazepril (LOTREL) 5-10 MG capsule Take 1 capsule by mouth daily. 90 capsule 1  . aspirin 81 MG tablet Take 81 mg by mouth daily.    Marland Kitchen. inFLIXimab (REMICADE) 100 MG injection Inject 450 mg into the vein every 8 (eight) weeks.    . metoprolol succinate (TOPROL-XL) 100 MG 24 hr tablet Take 1 tablet (100 mg total) by mouth daily. Take with or immediately following a meal. 90 tablet 1  . Pitavastatin Calcium (LIVALO) 2 MG TABS Take 1 tablet (2 mg total) by mouth daily. 90 tablet 1  .  UNABLE TO FIND Med Name: levolor 2mg  QD     No current facility-administered medications for this visit.    Allergies  Allergen Reactions  . Lipitor [Atorvastatin]   . Methotrexate Derivatives   . Penicillins   . Zocor [Simvastatin]       Review of Systems:  Constitutional:  No  fever, no chills, No recent illness.   Cardiac: No  chest pain  Respiratory:  No  shortness of breath.   Skin: No  Rash, No other wounds/concerning lesions except as noted per HPI  Hem/Onc: No  easy bruising/bleeding, No  abnormal lymph node  Exam:  BP 117/70   Pulse 73   Ht 6' (1.829 m)   Wt 187 lb (84.8 kg)   BMI 25.36 kg/m   Constitutional: VS see above. General Appearance: alert, well-developed, well-nourished, NAD  Musculoskeletal: Gait normal. No clubbing/cyanosis of digits.   Skin: warm, dry. Indurated mass above the right buttock, small ulceration, appears to be healing. Mass is nontender, oblong and irregular rather than cystic.     Psychiatric: Normal judgment/insight. Normal mood and affect. Oriented x3.    No results found for this or any previous visit (from the past 72 hour(s)).  [place] Misc Soft Tissue  Result Date: 08/04/2016 CLINICAL DATA:  Right lower back/ buttock firm subcutaneous mass for 20-25 years. EXAM: SOFT TISSUE ULTRASOUND - MISCELLANEOUS TECHNIQUE: Ultrasound examination of the  pelvic soft tissues was performed in the area of clinical concern. COMPARISON:  None. FINDINGS: There is a soft tissue hypoechoic complex heterogeneous mass in the area concern measuring 6.8 x 4.3 x 1.4 cm. This appears at least partially solid with areas of internal color flow. There may be some scattered cystic components. No other soft tissue abnormality noted. IMPRESSION: 6.8 cm predominantly solid complex hypoechoic nodule/ mass in the area concern within the right lower back/ buttock region. This is nonspecific by ultrasound and could be further evaluated with MRI. Electronically Signed   By:  Charlett Nose M.D.   On: 08/04/2016 09:30     ASSESSMENT/PLAN:   Ultrasound results. The patient. Due to the irregular appearance of the mass, no definitive diagnosis could be made an MRI was recommended for further evaluation. I explained to the patient that an MRI would be used to better characterize the mass that it would also be a first step to any further treatment such as [facility] radiology referral for biopsy to give a definitive diagnosis, which may or may not be neoplastic, or surgical referral for removal. Patient states that he would rather not go through an MRI or removal/biopsy at this point. He is aware that, in pursuing this course of action, we may be missing a serious diagnosis like a cancer/sarcoma.   Subcutaneous mass - Nonspecific on ultrasound, patient declines MRI for further workup, he is aware that this can result in missing a serious diagnosis such as neoplasm     All questions at time of visit were answered - patient instructed to contact office with any additional concerns. ER/RTC precautions were reviewed with the patient. Follow-up plan: Return if symptoms worsen or fail to improve.  Note: Total time spent 15 minutes, greater than 50% of the visit was spent face-to-face counseling and coordinating care for the following: The encounter diagnosis was Subcutaneous mass.Marland Kitchen

## 2016-10-30 ENCOUNTER — Ambulatory Visit (INDEPENDENT_AMBULATORY_CARE_PROVIDER_SITE_OTHER): Payer: BLUE CROSS/BLUE SHIELD | Admitting: Osteopathic Medicine

## 2016-10-30 ENCOUNTER — Encounter: Payer: Self-pay | Admitting: Osteopathic Medicine

## 2016-10-30 VITALS — BP 135/85 | HR 80 | Ht 72.0 in | Wt 190.0 lb

## 2016-10-30 DIAGNOSIS — M25561 Pain in right knee: Secondary | ICD-10-CM

## 2016-10-30 DIAGNOSIS — E785 Hyperlipidemia, unspecified: Secondary | ICD-10-CM | POA: Diagnosis not present

## 2016-10-30 DIAGNOSIS — I10 Essential (primary) hypertension: Secondary | ICD-10-CM

## 2016-10-30 DIAGNOSIS — Z0000 Encounter for general adult medical examination without abnormal findings: Secondary | ICD-10-CM

## 2016-10-30 DIAGNOSIS — Z2821 Immunization not carried out because of patient refusal: Secondary | ICD-10-CM

## 2016-10-30 LAB — LIPID PANEL
CHOL/HDL RATIO: 4.5 ratio (ref <5.0)
Cholesterol: 194 mg/dL (ref <200)
HDL: 43 mg/dL (ref >40)
LDL CALC: 108 mg/dL — AB (ref <100)
TRIGLYCERIDES: 214 mg/dL — AB (ref <150)
VLDL: 43 mg/dL — AB (ref <30)

## 2016-10-30 LAB — COMPLETE METABOLIC PANEL WITH GFR
ALT: 11 U/L (ref 9–46)
AST: 26 U/L (ref 10–35)
Albumin: 4.2 g/dL (ref 3.6–5.1)
Alkaline Phosphatase: 53 U/L (ref 40–115)
BILIRUBIN TOTAL: 0.9 mg/dL (ref 0.2–1.2)
BUN: 6 mg/dL — ABNORMAL LOW (ref 7–25)
CO2: 26 mmol/L (ref 20–31)
Calcium: 9.7 mg/dL (ref 8.6–10.3)
Chloride: 101 mmol/L (ref 98–110)
Creat: 0.76 mg/dL (ref 0.70–1.25)
GFR, Est African American: >89 mL/min (ref >=60)
GFR, Est Non African American: >89 mL/min (ref >=60)
GLUCOSE: 87 mg/dL (ref 65–99)
POTASSIUM: 4.1 mmol/L (ref 3.5–5.3)
SODIUM: 137 mmol/L (ref 135–146)
Total Protein: 8.2 g/dL — ABNORMAL HIGH (ref 6.1–8.1)

## 2016-10-30 LAB — CBC
HEMATOCRIT: 44.8 % (ref 38.5–50.0)
HEMOGLOBIN: 15.2 g/dL (ref 13.2–17.1)
MCH: 34.6 pg — ABNORMAL HIGH (ref 27.0–33.0)
MCHC: 33.9 g/dL (ref 32.0–36.0)
MCV: 102.1 fL — AB (ref 80.0–100.0)
MPV: 10.3 fL (ref 7.5–12.5)
Platelets: 255 10*3/uL (ref 140–400)
RBC: 4.39 MIL/uL (ref 4.20–5.80)
RDW: 14.2 % (ref 11.0–15.0)
WBC: 10 10*3/uL (ref 3.8–10.8)

## 2016-10-30 MED ORDER — AMLODIPINE BESY-BENAZEPRIL HCL 5-10 MG PO CAPS: 1.0000 | ORAL_CAPSULE | Freq: Every day | ORAL | 1 refills | Status: DC

## 2016-10-30 MED ORDER — PITAVASTATIN CALCIUM 2 MG PO TABS: 2.0000 mg | ORAL_TABLET | Freq: Every day | ORAL | 1 refills | Status: DC

## 2016-10-30 MED ORDER — METOPROLOL SUCCINATE ER 100 MG PO TB24: 100.0000 mg | ORAL_TABLET | Freq: Every day | ORAL | 1 refills | Status: DC

## 2016-10-30 NOTE — Progress Notes (Signed)
HPI: Bryan Benson is a 65 y.o. male  who presents to [facility] [place] today, 10/30/16,  for chief complaint of:  Chief Complaint  Patient presents with  . Annual Exam    Patient here for annual physical exam, his fasting 6 up for some coffee this morning. See below for review of preventive care. Patient also states his right knee has been bothering him a bit over the past few weeks. Would like this looked at wall he is here.  Skin on back is looking better from last visit, swelling has decreased, still some occasional discharge but overall not bothering him.  R knee pain . Context: fall few months ago may have resulted in favoring his right, no previous injuries in the past  . Location: sides, worse on medial  . Quality: sore . Duration: few weeks . Timing: intermittent . Modifying factors: Bengay and Tylenol which have helped  . Assoc signs/symptoms: no significant swelling/effusion, no gait instability    Past medical, surgical, social and family history reviewed: Patient Active Problem List   Diagnosis Date Noted  . Facial lesion 05/11/2015  . Gout 03/04/2015  . Microalbuminuria 03/04/2015  . Essential hypertension 02/08/2015  . Hyperlipidemia 02/08/2015  . Psoriatic arthritis (HCC) 02/08/2015   Past Surgical History:  Procedure Laterality Date  . APPENDECTOMY     age 5  . HERNIA REPAIR  [id number]  . TOTAL HIP ARTHROPLASTY  [id number]   Social History  Substance Use Topics  . Smoking status: Current Every Day Smoker    Packs/day: 2.00    Years: 48.00  . Smokeless tobacco: Never Used  . Alcohol use 4.2 oz/week    7 Standard drinks or equivalent per week   Family History  Problem Relation Age of Onset  . Stroke Father      Current medication list and allergy/intolerance information reviewed:   Current Outpatient Prescriptions  Medication Sig Dispense Refill  . amLODipine-benazepril (LOTREL) 5-10 MG capsule Take 1 capsule by  mouth daily. 90 capsule 1  . aspirin 81 MG tablet Take 81 mg by mouth daily.    Marland Kitchen inFLIXimab (REMICADE) 100 MG injection Inject 450 mg into the vein every 8 (eight) weeks.    . metoprolol succinate (TOPROL-XL) 100 MG 24 hr tablet Take 1 tablet (100 mg total) by mouth daily. Take with or immediately following a meal. 90 tablet 1  . Pitavastatin Calcium (LIVALO) 2 MG TABS Take 1 tablet (2 mg total) by mouth daily. 90 tablet 1  . UNABLE TO FIND Med Name: levolor 2mg  QD     No current facility-administered medications for this visit.    Allergies  Allergen Reactions  . Lipitor [Atorvastatin]   . Methotrexate Derivatives   . Penicillins   . Zocor [Simvastatin]       Review of Systems:  Constitutional:  No  fever, no chills, No recent illness   HEENT: No  headache, no vision change  Cardiac: No  chest pain, No  pressure, No palpitations, No  Orthopnea  Respiratory:  No  shortness of breath. No  Cough  Gastrointestinal: No  abdominal pain, No  nausea, No  vomiting,  No  blood in stool, No  diarrhea, No  constipation   Musculoskeletal: No new myalgia/arthralgia  Skin: No  Rash, No other wounds/concerning lesions  Neurologic: No  weakness, No  dizziness  Psychiatric: No  concerns with depression, No  concerns with anxiety, No sleep problems, No mood problems  Exam:  BP Marland Kitchen)  143/87   Pulse 80   Ht 6' (1.829 m)   Wt 190 lb (86.2 kg)   BMI 25.77 kg/m   Constitutional: VS see above. General Appearance: alert, well-developed, well-nourished, NAD  Eyes: Normal lids and conjunctive, non-icteric sclera  Ears, Nose, Mouth, Throat: MMM, Normal external inspection ears/nares/mouth/lips/gums. TM normal bilaterally. Pharynx/tonsils no erythema, no exudate. Nasal mucosa normal.   Neck: No masses, trachea midline. No thyroid enlargement. No tenderness/mass appreciated. No lymphadenopathy  Respiratory: Normal respiratory effort. no wheeze, no rhonchi, no rales  Cardiovascular: S1/S2  normal, no murmur, no rub/gallop auscultated. RRR. No lower extremity edema. Pedal pulse II/IV bilaterally DP and PT. No carotid bruit or JVD. No abdominal aortic bruit.  Gastrointestinal: Nontender, no masses. No hepatomegaly, no splenomegaly. No hernia appreciated. Bowel sounds normal. Rectal exam deferred.   Musculoskeletal: Gait normal. No clubbing/cyanosis of digits. No crepitus on passive motion of right knee, no effusion. Anterior/posterior drawer negative, negative McMurray's.   Neurological: Normal balance/coordination. No tremor. No cranial nerve deficit on limited exam. Motor and sensation intact and symmetric. Cerebellar reflexes intact.   Skin: warm, dry, intact. No rash. No concerning nevi or subq nodules on limited exam.  Area of concern on right lower back appears to be healing, nonpainful.  Psychiatric: Normal judgment/insight. Normal mood and affect. Oriented x3.      ASSESSMENT/PLAN:    Annual physical exam - Declines screening for colon cancer, prostate cancer, lung cancer, HIV, hepatitis C, abdominal aortic aneurysm  Essential hypertension - Plan: amLODipine-benazepril (LOTREL) 5-10 MG capsule, metoprolol succinate (TOPROL-XL) 100 MG 24 hr tablet, CBC, Lipid panel, COMPLETE METABOLIC PANEL WITH GFR  Hyperlipidemia, unspecified hyperlipidemia type - Plan: Pitavastatin Calcium (LIVALO) 2 MG TABS  Right knee pain, unspecified chronicity - Patient to cleanse x-ray, will trial home exercises, follow-up with sports medicine if no improvement  Influenza vaccination declined    MALE PREVENTIVE CARE  updated 10/30/16  ANNUAL SCREENING/COUNSELING  Any changes to health in the past year? no  Diet/Exercise - HEALTHY HABITS DISCUSSED TO DECREASE CV RISK History  Smoking Status  . Current Every Day Smoker  . Packs/day: 2.00  . Years: 50.00  Smokeless Tobacco  . Never Used   History  Alcohol Use  . 4.2 oz/week  . 7 Standard drinks or equivalent per week    [facility]/2016  Decreased Interest 0  Down, Depressed, Hopeless 0  PHQ - 2 Score 0    INFECTIOUS DISEASE SCREENING  HIV - needs - declined  GC/CT - does not need  HepC - needs - declined  TB - does not need  CANCER SCREENING  Lung - USPSTF: 55-80yo w/ 30 py hx unless quit w/in 7138yr - needs - declined  Colon - needs - declines  Prostate - declined  OTHER DISEASE SCREENING  Lipid - needs  DM2 - needs  AAA - 65-75yo ever smoked: needs - declined  ADULT VACCINATION  Influenza - annual vaccine recommended - declines  Td - booster every 10 years   Zoster - option at 5550, yes at 60+   PCV13 - was not indicated  PPSV23 - tobacco smoker, declines Immunization History  Administered Date(s) Administered  . Tdap 11/14/2007      Visit summary with medication list and pertinent instructions was printed for patient to review. All questions at time of visit were answered - patient instructed to contact office with any additional concerns. ER/RTC precautions were reviewed with the patient. Follow-up plan: Return in about 6  months (around 04/30/2017) for BLOOD PRESSURE RECHECK .

## 2017-01-22 IMAGING — US US MISC SOFT TISSUE
1 series · 14 of 16 positions shown · non-contrast
Comparison: None.

CLINICAL DATA: Right lower back/ buttock firm subcutaneous mass for
20-25 years.

EXAM:
SOFT TISSUE ULTRASOUND - MISCELLANEOUS
TECHNIQUE: Ultrasound examination of the pelvic soft tissues was performed in
the area of clinical concern.

[Series 1: us misc soft tissue · 0.07mm/px · 21 acquisitions, 14 frames shown]
[im 1/21]
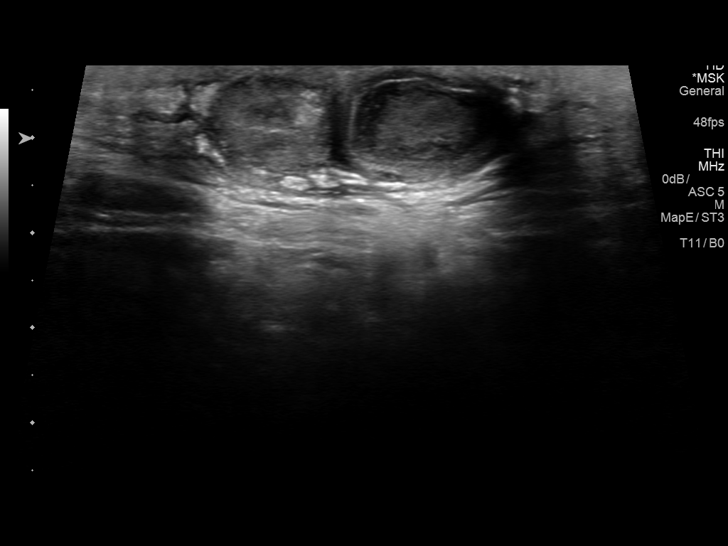
[im 2/21]
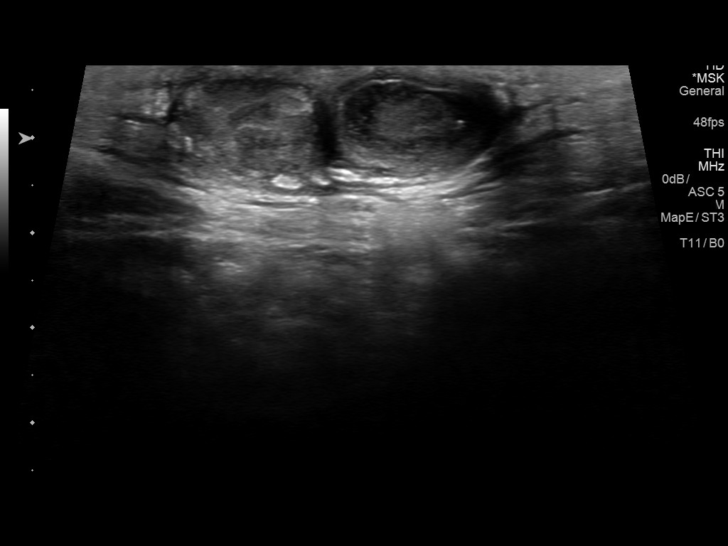
[im 3/21]
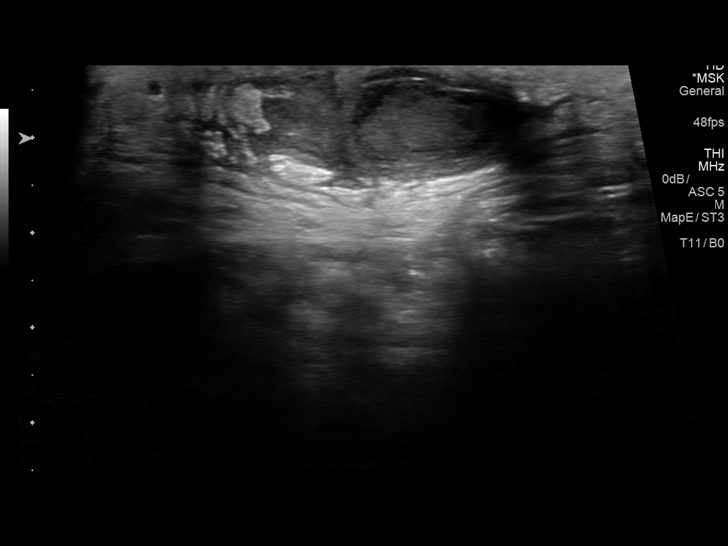
[im 6/21]
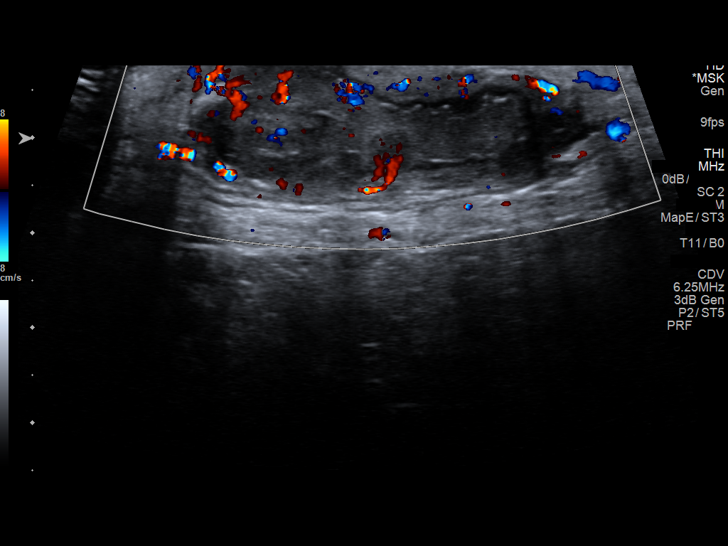
[im 7/21]
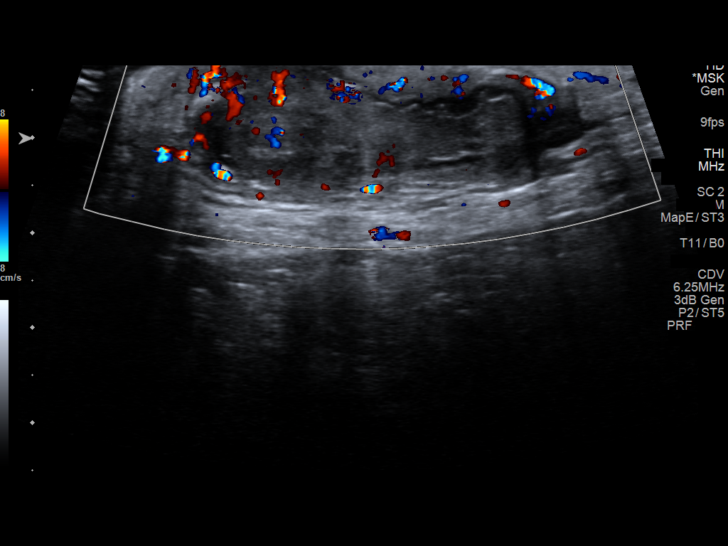
[im 9/21]
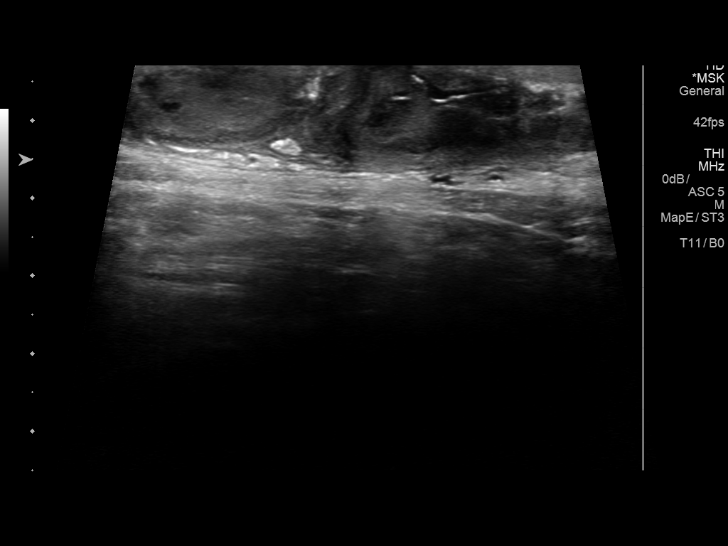
[im 10/21]
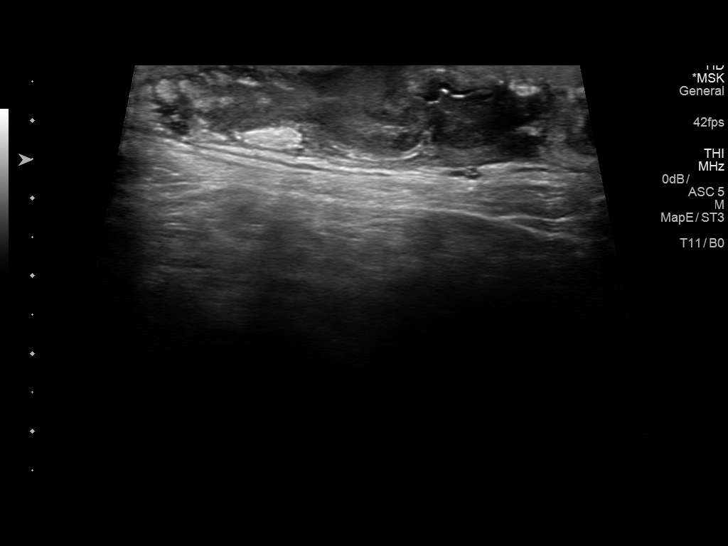
[im 11/21]
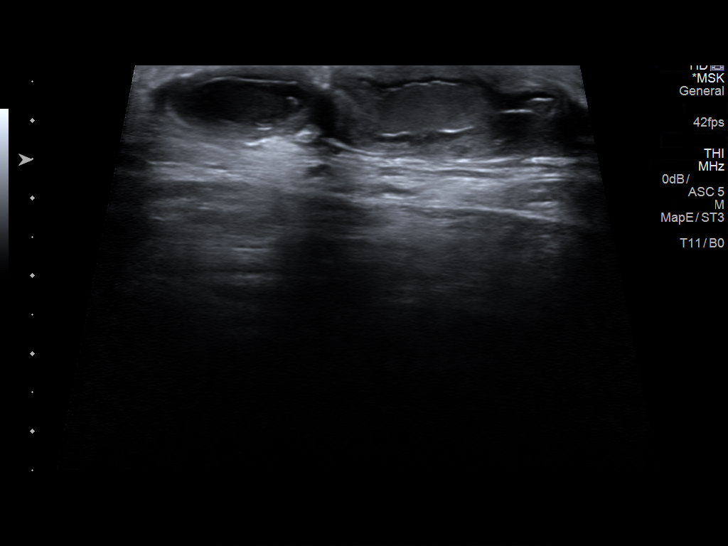
[im 13/21]
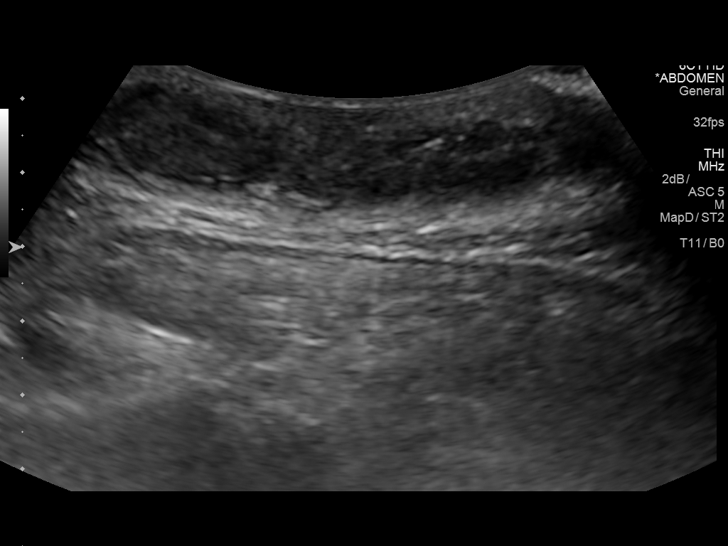
[im 14/21]
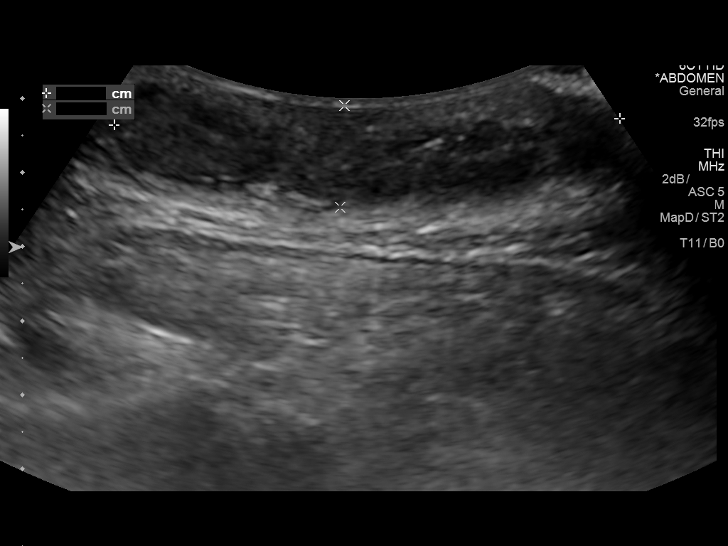
[im 17/21]
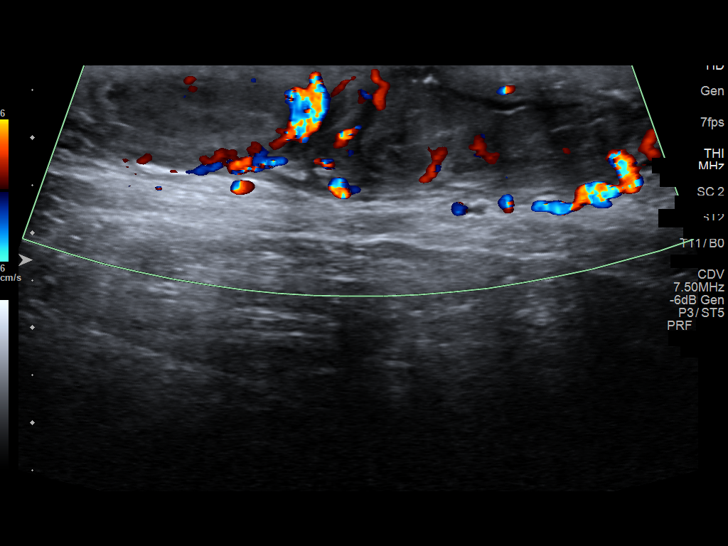
[im 18/21]
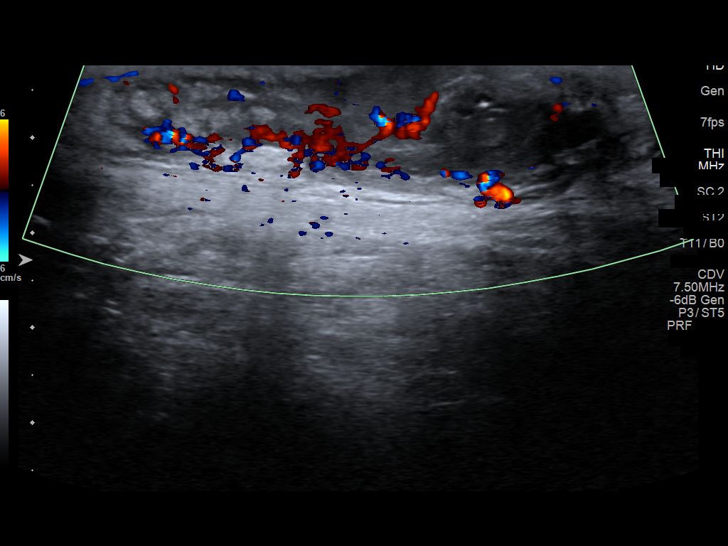
[im 19/21]
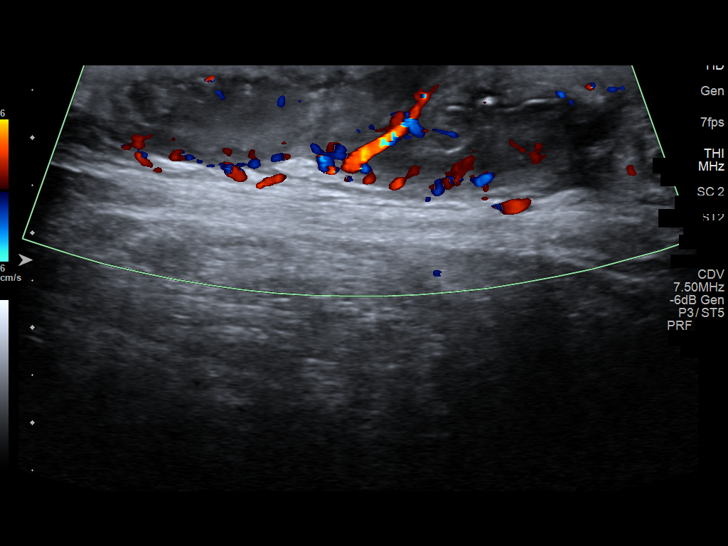
[im 21/21]
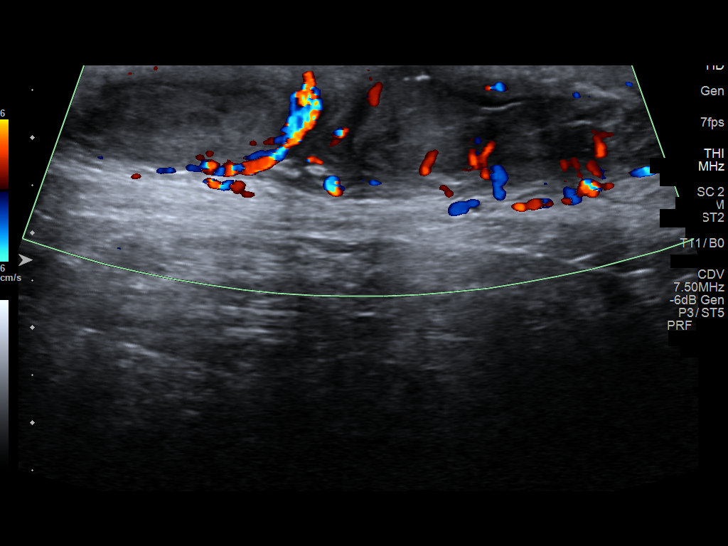

[14 of 16 positions shown; findings below may reference images not displayed]

FINDINGS: There is a soft tissue hypoechoic complex heterogeneous mass in the
area concern measuring 6.8 x 4.3 x 1.4 cm. This appears at least
partially solid with areas of internal color flow. There may be some
scattered cystic components. No other soft tissue abnormality noted.
IMPRESSION: 6.8 cm predominantly solid complex hypoechoic nodule/ mass in the
area concern within the right lower back/ buttock region. This is
nonspecific by ultrasound and could be further evaluated with MRI.

## 2017-04-24 ENCOUNTER — Ambulatory Visit (INDEPENDENT_AMBULATORY_CARE_PROVIDER_SITE_OTHER): Payer: BLUE CROSS/BLUE SHIELD | Admitting: Osteopathic Medicine

## 2017-04-24 ENCOUNTER — Encounter: Payer: Self-pay | Admitting: Osteopathic Medicine

## 2017-04-24 VITALS — BP 130/81 | HR 71 | Ht 72.0 in | Wt 196.0 lb

## 2017-04-24 DIAGNOSIS — E785 Hyperlipidemia, unspecified: Secondary | ICD-10-CM

## 2017-04-24 DIAGNOSIS — I10 Essential (primary) hypertension: Secondary | ICD-10-CM | POA: Diagnosis not present

## 2017-04-24 DIAGNOSIS — Z0000 Encounter for general adult medical examination without abnormal findings: Secondary | ICD-10-CM

## 2017-04-24 MED ORDER — ASPIRIN 81 MG PO TABS: 81.0000 mg | ORAL_TABLET | Freq: Every day | ORAL | 3 refills | Status: DC

## 2017-04-24 MED ORDER — METOPROLOL SUCCINATE ER 100 MG PO TB24: 100.0000 mg | ORAL_TABLET | Freq: Every day | ORAL | 3 refills | Status: DC

## 2017-04-24 MED ORDER — AMLODIPINE BESY-BENAZEPRIL HCL 5-10 MG PO CAPS: 1.0000 | ORAL_CAPSULE | Freq: Every day | ORAL | 3 refills | Status: DC

## 2017-04-24 MED ORDER — PITAVASTATIN CALCIUM 2 MG PO TABS: 2.0000 mg | ORAL_TABLET | Freq: Every day | ORAL | 3 refills | Status: DC

## 2017-04-24 NOTE — Progress Notes (Signed)
HPI: Bryan Benson is a 66 y.o. male  who [facility] [place] today, 04/24/17,  for chief complaint of:  Chief Complaint  Patient presents with  . Follow-up    medication refills    Hypertension: Controlled on current medication regimen, not out of medicines at this point. Blood pressure as below, no chest pain, pressure, short of breath. No home blood pressures report  Hyperlipidemia: Doing well on Livalo  Skin: Cyst on lower back seems to have resolved without incident, no pain/drainage   Past medical history, surgical history, social history and family history reviewed.  Patient Active Problem List   Diagnosis Date Noted  . Facial lesion 05/11/2015  . Gout 03/04/2015  . Microalbuminuria 03/04/2015  . Essential hypertension 02/08/2015  . Hyperlipidemia 02/08/2015  . Psoriatic arthritis (HCC) 02/08/2015    Current medication list and allergy/intolerance information reviewed.   Current Outpatient Prescriptions on File Prior to Visit  Medication Sig Dispense Refill  . amLODipine-benazepril (LOTREL) 5-10 MG capsule Take 1 capsule by mouth daily. 90 capsule 1  . aspirin 81 MG tablet Take 81 mg by mouth daily.    Marland Kitchen. inFLIXimab (REMICADE) 100 MG injection Inject 450 mg into the vein every 8 (eight) weeks.    . metoprolol succinate (TOPROL-XL) 100 MG 24 hr tablet Take 1 tablet (100 mg total) by mouth daily. Take with or immediately following a meal. 90 tablet 1  . Pitavastatin Calcium (LIVALO) 2 MG TABS Take 1 tablet (2 mg total) by mouth daily. 90 tablet 1  . UNABLE TO FIND Med Name: levolor 2mg  QD     No current facility-administered medications on file prior to visit.    Allergies  Allergen Reactions  . Lipitor [Atorvastatin]   . Methotrexate Derivatives   . Penicillins   . Zocor [Simvastatin]       Review of Systems:  Constitutional: No recent illness  HEENT: No  headache, no vision change  Cardiac: No  chest pain, No   pressure, No palpitations  Respiratory:  No  shortness of breath. No  Cough  Neurologic: No  weakness, No  Dizziness   Exam:  BP 130/81   Pulse 71   Ht 6' (1.829 m)   Wt 196 lb (88.9 kg)   BMI 26.58 kg/m   Constitutional: VS see above. General Appearance: alert, well-developed, well-nourished, NAD  Eyes: Normal lids and conjunctive, non-icteric sclera  Ears, Nose, Mouth, Throat: MMM, Normal external inspection ears/nares/mouth/lips/gums.  Neck: No masses, trachea midline.   Respiratory: Normal respiratory effort. no wheeze, no rhonchi, no rales  Cardiovascular: S1/S2 normal, no murmur, no rub/gallop auscultated. RRR.   Musculoskeletal: Gait normal. Symmetric and independent movement of all extremities  Neurological: Normal balance/coordination. No tremor.  Skin: warm, dry, intact.   Psychiatric: Normal judgment/insight. Normal mood and affect. Oriented x3.      ASSESSMENT/PLAN:   Essential hypertension - Plan: amLODipine-benazepril (LOTREL) 5-10 MG capsule, metoprolol succinate (TOPROL-XL) 100 MG 24 hr tablet  Hyperlipidemia, unspecified hyperlipidemia type - Plan: Pitavastatin Calcium (LIVALO) 2 MG TABS  Annual physical exam - Plan: CBC, COMPLETE METABOLIC PANEL WITH GFR, Lipid panel, TSH, PSA, Total with Reflex to PSA, Free     Follow-up plan: Return for ANNUAL PHYSICAL 10/2017 - can get labs ahead of time - call the office .  Visit summary with medication list and pertinent instructions was printed for patient to review, alert us if any changes needed. All questions at time of visit were answered -  patient instructed to contact office with any additional concerns. ER/RTC precautions were reviewed with the patient and understanding verbalized.   Orders in place for labs for upcoming annual physical, preventive care was not performed or billed today

## 2017-10-15 ENCOUNTER — Telehealth: Payer: Self-pay | Admitting: Osteopathic Medicine

## 2017-10-15 DIAGNOSIS — Z0000 Encounter for general adult medical examination without abnormal findings: Secondary | ICD-10-CM

## 2017-10-15 NOTE — Telephone Encounter (Signed)
Patient has a physical scheduled for 10/30/17 and would like to get labs done on 10/29/17

## 2017-10-16 NOTE — Telephone Encounter (Signed)
Pt notified lab orders placed. KG LPN

## 2017-10-16 NOTE — Telephone Encounter (Signed)
Orders in 

## 2017-10-16 NOTE — Telephone Encounter (Signed)
Can you please send lab orders to lab. KG LPN

## 2017-10-26 LAB — CBC
HCT: 43 % (ref 38.5–50.0)
Hemoglobin: 15.5 g/dL (ref 13.2–17.1)
MCH: 35.9 pg — AB (ref 27.0–33.0)
MCHC: 36 g/dL (ref 32.0–36.0)
MCV: 99.5 fL (ref 80.0–100.0)
MPV: 11.3 fL (ref 7.5–12.5)
PLATELETS: 162 10*3/uL (ref 140–400)
RBC: 4.32 10*6/uL (ref 4.20–5.80)
RDW: 13 % (ref 11.0–15.0)
WBC: 6.9 10*3/uL (ref 3.8–10.8)

## 2017-10-26 LAB — LIPID PANEL
Cholesterol: 212 mg/dL — ABNORMAL HIGH (ref <200)
HDL: 69 mg/dL (ref >40)
LDL Cholesterol (Calc): 122 mg/dL (calc) — ABNORMAL HIGH
Non-HDL Cholesterol (Calc): 143 mg/dL (calc) — ABNORMAL HIGH (ref <130)
TRIGLYCERIDES: 104 mg/dL (ref <150)
Total CHOL/HDL Ratio: 3.1 (calc) (ref <5.0)

## 2017-10-26 LAB — COMPLETE METABOLIC PANEL WITH GFR
AG Ratio: 1.3 (calc) (ref 1.0–2.5)
ALT: 24 U/L (ref 9–46)
AST: 39 U/L — ABNORMAL HIGH (ref 10–35)
Albumin: 4.5 g/dL (ref 3.6–5.1)
Alkaline phosphatase (APISO): 46 U/L (ref 40–115)
BUN/Creatinine Ratio: 10 (calc) (ref 6–22)
BUN: 7 mg/dL (ref 7–25)
CO2: 24 mmol/L (ref 20–32)
Calcium: 9.6 mg/dL (ref 8.6–10.3)
Chloride: 98 mmol/L (ref 98–110)
Creat: 0.69 mg/dL — ABNORMAL LOW (ref 0.70–1.25)
GFR, EST AFRICAN AMERICAN: 116 mL/min/{1.73_m2} (ref >=60)
GFR, Est Non African American: 100 mL/min/{1.73_m2} (ref >=60)
Globulin: 3.6 g/dL (calc) (ref 1.9–3.7)
Glucose, Bld: 93 mg/dL (ref 65–99)
POTASSIUM: 3.6 mmol/L (ref 3.5–5.3)
Sodium: 137 mmol/L (ref 135–146)
TOTAL PROTEIN: 8.1 g/dL (ref 6.1–8.1)
Total Bilirubin: 1.2 mg/dL (ref 0.2–1.2)

## 2017-10-26 LAB — PSA, TOTAL WITH REFLEX TO PSA, FREE: PSA, Total: 0.3 ng/mL (ref <=4.0)

## 2017-10-30 ENCOUNTER — Ambulatory Visit (INDEPENDENT_AMBULATORY_CARE_PROVIDER_SITE_OTHER): Payer: BLUE CROSS/BLUE SHIELD | Admitting: Osteopathic Medicine

## 2017-10-30 ENCOUNTER — Encounter: Payer: Self-pay | Admitting: Osteopathic Medicine

## 2017-10-30 VITALS — BP 134/71 | HR 77 | Temp 98.4°F | Wt 193.3 lb

## 2017-10-30 DIAGNOSIS — R7401 Elevation of levels of liver transaminase levels: Secondary | ICD-10-CM

## 2017-10-30 DIAGNOSIS — Z0000 Encounter for general adult medical examination without abnormal findings: Secondary | ICD-10-CM

## 2017-10-30 DIAGNOSIS — Z716 Tobacco abuse counseling: Secondary | ICD-10-CM | POA: Diagnosis not present

## 2017-10-30 DIAGNOSIS — I10 Essential (primary) hypertension: Secondary | ICD-10-CM

## 2017-10-30 DIAGNOSIS — R740 Nonspecific elevation of levels of transaminase and lactic acid dehydrogenase [LDH]: Secondary | ICD-10-CM

## 2017-10-30 DIAGNOSIS — E785 Hyperlipidemia, unspecified: Secondary | ICD-10-CM | POA: Diagnosis not present

## 2017-10-30 MED ORDER — PITAVASTATIN CALCIUM 2 MG PO TABS: 2.0000 mg | ORAL_TABLET | Freq: Every day | ORAL | 3 refills | Status: DC

## 2017-10-30 MED ORDER — METOPROLOL SUCCINATE ER 100 MG PO TB24: 100.0000 mg | ORAL_TABLET | Freq: Every day | ORAL | 3 refills | Status: DC

## 2017-10-30 MED ORDER — AMLODIPINE BESY-BENAZEPRIL HCL 5-10 MG PO CAPS
1.0000 | ORAL_CAPSULE | Freq: Every day | ORAL | 3 refills | Status: DC
Start: 2017-10-30 — End: 2018-04-30

## 2017-10-30 MED ORDER — ASPIRIN 81 MG PO TABS
81.0000 mg | ORAL_TABLET | Freq: Every day | ORAL | 3 refills | Status: DC
Start: 2017-10-30 — End: 2018-07-18

## 2017-10-30 NOTE — Patient Instructions (Signed)
PLAN: Recheck labs in January for liver enzyme Recommend lung and colon cancer screening, flu and pneumonia vaccines Any questions, let us know!

## 2017-10-30 NOTE — Progress Notes (Signed)
HPI: Bryan Benson is a 66 y.o. male  who [facility] to [facility] [place] today, 10/30/17,  for chief complaint of:  Chief Complaint  Patient presents with  . Annual Exam  . Results    Patient here for annual physical exam, See below for review of preventive care. No complaints today. Labs reviewed in detail. ASCVD risk 10 year 18%    Past medical, surgical, social and family history reviewed: Patient Active Problem List   Diagnosis Date Noted  . Facial lesion 05/11/2015  . Gout 03/04/2015  . Microalbuminuria 03/04/2015  . Essential hypertension 02/08/2015  . Hyperlipidemia 02/08/2015  . Psoriatic arthritis (HCC) 02/08/2015   Past Surgical History:  Procedure Laterality Date  . APPENDECTOMY     age 89  . HERNIA REPAIR  [id number],2004  . TOTAL HIP ARTHROPLASTY  7829,[id number],2006   Social History   Tobacco Use  . Smoking status: Current Every Day Smoker    Packs/day: 2.00    Years: 50.00    Pack years: 100.00  . Smokeless tobacco: Never Used  Substance Use Topics  . Alcohol use: Yes    Alcohol/week: 4.2 oz    Types: 7 Standard drinks or equivalent per week    Comment: 2-3 drink per night    Family History  Problem Relation Age of Onset  . Stroke Father      Current medication list and allergy/intolerance information reviewed:   Current Outpatient Medications  Medication Sig Dispense Refill  . amLODipine-benazepril (LOTREL) 5-10 MG capsule Take 1 capsule by mouth daily. 90 capsule 3  . aspirin 81 MG tablet Take 1 tablet (81 mg total) by mouth daily. 90 tablet 3  . inFLIXimab (REMICADE) 100 MG injection Inject 450 mg into the vein every 8 (eight) weeks.    . metoprolol succinate (TOPROL-XL) 100 MG 24 hr tablet Take 1 tablet (100 mg total) by mouth daily. Take with or immediately following a meal. 90 tablet 3  . Pitavastatin Calcium (LIVALO) 2 MG TABS Take 1 tablet (2 mg total) by mouth daily. 90 tablet 3   No current facility-administered  medications for this visit.    Allergies  Allergen Reactions  . Lipitor [Atorvastatin]   . Methotrexate Derivatives   . Penicillins   . Zocor [Simvastatin]       Review of Systems:  Constitutional:  No  fever, no chills, No recent illness   HEENT: No  headache, no vision change  Cardiac: No  chest pain, No  pressure, No palpitations, No  Orthopnea  Respiratory:  No  shortness of breath. No  Cough  Gastrointestinal: No  abdominal pain, No  nausea, No  vomiting,  No  blood in stool, No  diarrhea, No  constipation   Musculoskeletal: No new myalgia/arthralgia  Skin: No  Rash, No other wounds/concerning lesions  Neurologic: No  weakness, No  dizziness  Psychiatric: No  concerns with depression, No  concerns with anxiety, No sleep problems, No mood problems  Exam:  BP 134/71   Pulse 77   Temp 98.4 F (36.9 C)   Wt 193 lb 4.8 oz (87.7 kg)   BMI 26.22 kg/m   Constitutional: VS see above. General Appearance: alert, well-developed, well-nourished, NAD  Eyes: Normal lids and conjunctive, non-icteric sclera  Ears, Nose, Mouth, Throat: MMM, Normal external inspection ears/nares/mouth/lips/gums. TM normal bilaterally. Pharynx/tonsils no erythema, no exudate. Nasal mucosa normal.   Neck: No masses, trachea midline. No thyroid enlargement. No tenderness/mass appreciated. No lymphadenopathy  Respiratory: Normal  respiratory effort. no wheeze, no rhonchi, no rales  Cardiovascular: S1/S2 normal, no murmur, no rub/gallop auscultated. RRR. No lower extremity edema.   Gastrointestinal: Nontender, no masses. No hepatomegaly, no splenomegaly. No hernia appreciated. Bowel sounds normal. Rectal exam deferred.   Musculoskeletal: Gait normal. No clubbing/cyanosis of digits.   Neurological: Normal balance/coordination. No tremor.   Skin: warm, dry, intact. No rash.  Psychiatric: Normal judgment/insight. Normal mood and affect. Oriented x3.      ASSESSMENT/PLAN:   Annual physical  exam  Essential hypertension - Plan: amLODipine-benazepril (LOTREL) 5-10 MG capsule, metoprolol succinate (TOPROL-XL) 100 MG 24 hr tablet  Hyperlipidemia, unspecified hyperlipidemia type - Plan: Pitavastatin Calcium (LIVALO) 2 MG TABS  Elevated AST (SGOT) - Plan: COMPLETE METABOLIC PANEL WITH GFR  Tobacco abuse counseling    MALE PREVENTIVE CARE  updated 10/30/17  ANNUAL SCREENING/COUNSELING  Any changes to health in the past year? no  Diet/Exercise - HEALTHY HABITS DISCUSSED TO DECREASE CV RISK Social History   Tobacco Use  Smoking Status Current Every Day Smoker  . Packs/day: 2.00  . Years: 50.00  . Pack years: 100.00  Smokeless Tobacco Never Used   Social History   Substance and Sexual Activity  Alcohol Use Yes  . Alcohol/week: 4.2 oz  . Types: 7 Standard drinks or equivalent per week   Comment: 2-3 drink per night    [facility] 04/24/2017  Decreased Interest 0  Down, Depressed, Hopeless 0  PHQ - 2 Score 0    INFECTIOUS DISEASE SCREENING  HIV - needs - declined  GC/CT - does not need  HepC - needs - declined  TB - does not need  CANCER SCREENING  Lung - USPSTF: 55-80yo w/ 30 py hx unless quit w/in 60108yr - needs - declined  Colon - needs - declined  Prostate - does not need, PSA ok   OTHER DISEASE SCREENING  Lipid - done  DM2 - done  AAA - 65-75yo ever smoked: needs - declined  ADULT VACCINATION   Influenza - annual vaccine recommended - declines  Td - booster every 10 years   Zoster - option at 7650, yes at 60+   PCV13 - was not indicated  PPSV23 - tobacco smoker, declines Immunization History  Administered Date(s) Administered  . Tdap 11/14/2007      Visit summary with medication list and pertinent instructions was printed for patient to review. All questions at time of visit were answered - patient instructed to contact office with any additional concerns. ER/RTC precautions were reviewed with the patient.    Follow-up plan: Return in about 6 months (around 04/30/2018) for recheck blood pressure, sooner if needed .

## 2017-11-20 ENCOUNTER — Other Ambulatory Visit: Payer: Self-pay

## 2017-11-20 ENCOUNTER — Encounter: Payer: Self-pay | Admitting: Emergency Medicine

## 2017-11-20 ENCOUNTER — Emergency Department
Admission: EM | Admit: 2017-11-20 | Discharge: 2017-11-20 | Disposition: A | Discharge status: Home / Self Care | Payer: BLUE CROSS/BLUE SHIELD | Source: Home / Self Care | Attending: Family Medicine | Admitting: Family Medicine

## 2017-11-20 DIAGNOSIS — J069 Acute upper respiratory infection, unspecified: Secondary | ICD-10-CM

## 2017-11-20 DIAGNOSIS — B9789 Other viral agents as the cause of diseases classified elsewhere: Secondary | ICD-10-CM

## 2017-11-20 MED ORDER — AZITHROMYCIN 250 MG PO TABS: ORAL_TABLET | ORAL | 0 refills | Status: DC

## 2017-11-20 NOTE — ED Triage Notes (Signed)
Congestion, eyes hurt, cough x 4 days

## 2017-11-20 NOTE — Discharge Instructions (Signed)
Take plain guaifenesin (1200mg extended release tabs such as Mucinex) twice daily, with plenty of water, for cough and congestion.  Get adequate rest.   °May use Afrin nasal spray (or generic oxymetazoline) each morning for about 5 days and then discontinue.  Also recommend using saline nasal spray several times daily and saline nasal irrigation (AYR is a common brand).  °Try warm salt water gargles for sore throat.  °Stop all antihistamines for now, and other non-prescription cough/cold preparations. °May take Delsym Cough Suppressant at bedtime for nighttime cough.  °

## 2017-11-20 NOTE — ED Provider Notes (Signed)
Bryan Benson    CSN: [id number] Arrival date & time: 11/20/17  0950     History   Chief Complaint Chief Complaint  Patient presents with  . Nasal Congestion    HPI Bryan Benson is a 67 y.o. male.   During the past 6 days patient has developed fatigue, non-productive cough, sinus congestion, and pressure behind his eyes.  No fevers, chills, and sweats. He receives Remicade for his psoriasis.   The history is provided by the patient.    Past Medical History:  Diagnosis Date  . Hypertension   . Psoriatic arthritis [facility](HCC)     Patient Active Problem List   Diagnosis Date Noted  . Facial lesion 05/11/2015  . Gout 03/04/2015  . Microalbuminuria 03/04/2015  . Essential hypertension 02/08/2015  . Hyperlipidemia 02/08/2015  . Psoriatic arthritis (HCC) 02/08/2015    Past Surgical History:  Procedure Laterality Date  . APPENDECTOMY     age 89  . HERNIA REPAIR  [id number],2004  . TOTAL HIP ARTHROPLASTY  2005,2006       Home Medications    Prior to Admission medications   Medication Sig Start Date End Date Taking? Authorizing Provider  amLODipine-benazepril (LOTREL) 5-10 MG capsule Take 1 capsule by mouth daily. 10/30/17   Sunnie NielsenAlexander, Natalie, DO  aspirin 81 MG tablet Take 1 tablet (81 mg total) by mouth daily. 10/30/17   Sunnie NielsenAlexander, Natalie, DO  azithromycin (ZITHROMAX Z-PAK) 250 MG tablet Take 2 tabs today; then begin one tab once daily for 4 more days. 11/20/17   Lattie HawBeese, Yadira Hada A, MD  inFLIXimab (REMICADE) 100 MG injection Inject 450 mg into the vein every 8 (eight) weeks.    [provider]  metoprolol succinate (TOPROL-XL) 100 MG 24 hr tablet Take 1 tablet (100 mg total) by mouth daily. Take with or immediately following a meal. 10/30/17   Sunnie NielsenAlexander, Natalie, DO  Pitavastatin Calcium (LIVALO) 2 MG TABS Take 1 tablet (2 mg total) by mouth daily. 10/30/17   Sunnie NielsenAlexander, Natalie, DO    Family History Family History  Problem Relation Age of Onset  .  Stroke Father   . Ovarian cysts Daughter     Social History Social History   Tobacco Use  . Smoking status: Current Every [name]    Packs/Benson: 1.50    Years: 50.00    Pack years: 75.00  . Smokeless tobacco: Never Used  Substance Use Topics  . Alcohol use: Yes    Comment: 1-3 drink per night (wine or hard liquor)   . Drug use: No     Allergies   Lipitor [atorvastatin]; Methotrexate derivatives; Penicillins; and Zocor [simvastatin]   Review of Systems Review of Systems No sore throat + cough No pleuritic pain No wheezing + nasal congestion + post-nasal drainage No sinus pain/pressure No itchy/red eyes No earache No hemoptysis No SOB No fever/chills No nausea No vomiting No abdominal pain No diarrhea No urinary symptoms No skin rash + fatigue No myalgias No headache Used OTC meds without relief   Physical Exam Triage Vital Signs ED Triage Vitals  Enc Vitals Group     BP 11/20/17 1125 (!) 145/84     Pulse Rate 11/20/17 1125 82     Resp --      Temp 11/20/17 1125 97.7 F (36.5 C)     Temp Source 11/20/17 1125 Oral     SpO2 11/20/17 1125 99 %     Weight 11/20/17 1126 193 lb (87.5 kg)     Height  11/20/17 1126 6' (1.829 m)     Head Circumference --      Peak Flow --      Pain Score 11/20/17 1126 2     Pain Loc --      Pain Edu? --      Excl. in GC? --    No data found.  Updated Vital Signs BP (!) 145/84 (BP Location: Right Arm)   Pulse 82   Temp 97.7 F (36.5 C) (Oral)   Ht 6' (1.829 m)   Wt 193 lb (87.5 kg)   SpO2 99%   BMI 26.18 kg/m   Visual Acuity Right Eye Distance:   Left Eye Distance:   Bilateral Distance:    Right Eye Near:   Left Eye Near:    Bilateral Near:     Physical Exam Nursing notes and Vital Signs reviewed. Appearance:  Patient appears stated age, and in no acute distress Eyes:  Pupils are equal, round, and reactive to light and accomodation.  Extraocular movement is intact.  Conjunctivae are not inflamed    Ears:  Canals normal.  Tympanic membranes normal.  Nose:  Mildly congested turbinates.  No sinus tenderness.   Pharynx:  Normal Neck:  Supple.  Enlarged posterior/lateral nodes are palpated bilaterally, tender to palpation on the left.   Lungs:  Clear to auscultation.  Breath sounds are equal.  Moving air well. Heart:  Regular rate and rhythm without murmurs, rubs, or gallops.  Abdomen:  Nontender without masses or hepatosplenomegaly.  Bowel sounds are present.  No CVA or flank tenderness.  Extremities:  No edema.  Skin:  No rash present.    UC Treatments / Results  Labs (all labs ordered are listed, but only abnormal results are displayed) Labs Reviewed - No data to display  EKG  EKG Interpretation None       Radiology No results found.  Procedures Procedures (including critical Benson time)  Medications Ordered in UC Medications - No data to display   Initial Impression / Assessment and Plan / UC Course  I have reviewed the triage vital signs and the nursing notes.  Pertinent labs & imaging results that were available during my Benson of the patient were reviewed by me and considered in my medical decision making (see chart for details).    Because of possible immunosuppression, will begin empiric Z-pak Take plain guaifenesin (1200mg  extended release tabs such as Mucinex) twice daily, with plenty of water, for cough and congestion.   Get adequate rest.   May use Afrin nasal spray (or generic oxymetazoline) each morning for about 5 days and then discontinue.  Also recommend using saline nasal spray several times daily and saline nasal irrigation (AYR is a common brand).    Try warm salt water gargles for sore throat.  Stop all antihistamines for now, and other non-prescription cough/cold preparations. May take Delsym Cough Suppressant at bedtime for nighttime cough.  Followup with Family Doctor if not improved in about 10 days.    Final Clinical Impressions(s) / UC  Diagnoses   Final diagnoses:  Viral URI with cough    ED Discharge Orders        Ordered    azithromycin (ZITHROMAX Z-PAK) 250 MG tablet     11/20/17 1144          Lattie Haw, MD 11/25/17 2023

## 2017-12-05 LAB — COMPLETE METABOLIC PANEL WITH GFR
AG Ratio: 1.2 (calc) (ref 1.0–2.5)
ALBUMIN MSPROF: 4.4 g/dL (ref 3.6–5.1)
ALT: 22 U/L (ref 9–46)
AST: 38 U/L — ABNORMAL HIGH (ref 10–35)
Alkaline phosphatase (APISO): 49 U/L (ref 40–115)
BUN: 7 mg/dL (ref 7–25)
CO2: 26 mmol/L (ref 20–32)
CREATININE: 0.85 mg/dL (ref 0.70–1.25)
Calcium: 10.2 mg/dL (ref 8.6–10.3)
Chloride: 99 mmol/L (ref 98–110)
GFR, EST AFRICAN AMERICAN: 107 mL/min/{1.73_m2} (ref >=60)
GFR, Est Non African American: 92 mL/min/{1.73_m2} (ref >=60)
GLUCOSE: 100 mg/dL — AB (ref 65–99)
Globulin: 3.8 g/dL (calc) — ABNORMAL HIGH (ref 1.9–3.7)
Potassium: 4.3 mmol/L (ref 3.5–5.3)
Sodium: 137 mmol/L (ref 135–146)
Total Bilirubin: 1.2 mg/dL (ref 0.2–1.2)
Total Protein: 8.2 g/dL — ABNORMAL HIGH (ref 6.1–8.1)

## 2018-01-02 ENCOUNTER — Ambulatory Visit (INDEPENDENT_AMBULATORY_CARE_PROVIDER_SITE_OTHER): Payer: BLUE CROSS/BLUE SHIELD

## 2018-01-02 ENCOUNTER — Encounter: Payer: Self-pay | Admitting: Sports Medicine

## 2018-01-02 ENCOUNTER — Ambulatory Visit: Payer: BLUE CROSS/BLUE SHIELD | Admitting: Sports Medicine

## 2018-01-02 DIAGNOSIS — R05 Cough: Secondary | ICD-10-CM | POA: Diagnosis not present

## 2018-01-02 DIAGNOSIS — R0602 Shortness of breath: Secondary | ICD-10-CM | POA: Diagnosis not present

## 2018-01-02 DIAGNOSIS — F17200 Nicotine dependence, unspecified, uncomplicated: Secondary | ICD-10-CM

## 2018-01-02 MED ORDER — DOXYCYCLINE HYCLATE 100 MG PO TABS: 100.0000 mg | ORAL_TABLET | Freq: Two times a day (BID) | ORAL | 0 refills | Status: AC

## 2018-01-02 MED ORDER — DEXAMETHASONE 4 MG PO TABS: 4.0000 mg | ORAL_TABLET | Freq: Three times a day (TID) | ORAL | 0 refills | Status: DC

## 2018-01-02 NOTE — Assessment & Plan Note (Signed)
Return for pre-and postbronchodilator spirometry, he is also interested in discussing smoking cessation at his follow-up visit, I do suspect we will use Chantix.

## 2018-01-02 NOTE — Assessment & Plan Note (Signed)
Decadron, patient declines prednisone, doxycycline.   Chest x-ray. He is a long-term smoker, I do think he probably has COPD. Return in 2 weeks for pre-and postbronchodilator spirometry.

## 2018-01-02 NOTE — Progress Notes (Signed)
  Subjective:    CC: Feeling sick  HPI: This is a pleasant 67 year old male smoker, he does have psoriatic arthritis currently on Remicade.  For the past 5 days he said increasing cough, shortness of breath, wheezing.  Muscle aches, fevers and chills.  No overt chest pain, no presyncope, no skin rash, no GI symptoms.  I reviewed the past medical history, family history, social history, surgical history, and allergies today and no changes were needed.  Please see the problem list section below in epic for further details.  Past Medical History: Past Medical History:  Diagnosis Date  . Hypertension   . Psoriatic arthritis [facility](HCC)    Past Surgical History: Past Surgical History:  Procedure Laterality Date  . APPENDECTOMY     age 89  . HERNIA REPAIR  [id number],2004  . TOTAL HIP ARTHROPLASTY  [id number],2006   Social History: Social History   Socioeconomic History  . Marital status: Married    Spouse name: None  . Number of children: None  . Years of education: None  . Highest education level: None  Social Needs  . Financial resource strain: None  . Food insecurity - worry: None  . Food insecurity - inability: None  . Transportation needs - medical: None  . Transportation needs - non-medical: None  Occupational History  . None  Tobacco Use  . Smoking status: Current Every Day Smoker    Packs/day: 1.50    Years: 50.00    Pack years: 75.00  . Smokeless tobacco: Never Used  Substance and Sexual Activity  . Alcohol use: Yes    Comment: 1-3 drink per night (wine or hard liquor)   . Drug use: No  . Sexual activity: Yes    Partners: Female  Other Topics Concern  . None  Social History Narrative  . None   Family History: Family History  Problem Relation Age of Onset  . Stroke Father   . Ovarian cysts Daughter    Allergies: Allergies  Allergen Reactions  . Lipitor [Atorvastatin]   . Methotrexate Derivatives   . Penicillins   . Zocor [Simvastatin]    Medications: See med  rec.  Review of Systems: No fevers, chills, night sweats, weight loss, chest pain, or shortness of breath.   Objective:    General: Well Developed, well nourished, and in no acute distress.  Neuro: Alert and oriented x3, extra-ocular muscles intact, sensation grossly intact.  HEENT: Normocephalic, atraumatic, pupils equal round reactive to light, neck supple, no masses, no lymphadenopathy, thyroid nonpalpable.  Oropharynx, nasopharynx, ear canals unremarkable Skin: Warm and dry, no rashes. Cardiac: Regular rate and rhythm, no murmurs rubs or gallops, no lower extremity edema.  Respiratory: Coarse breath sounds in the right upper lobe. Not using accessory muscles, speaking in full sentences.  Impression and Recommendations:    Shortness of breath Decadron, patient declines prednisone, doxycycline.   Chest x-ray. He is a long-term smoker, I do think he probably has COPD. Return in 2 weeks for pre-and postbronchodilator spirometry.  Smoker Return for pre-and postbronchodilator spirometry, he is also interested in discussing smoking cessation at his follow-up visit, I do suspect we will use Chantix. ___________________________________________ Ihor Austinhomas J. Benjamin Stainhekkekandam, M.D., ABFM., CAQSM. Primary Care and Sports Medicine [facility]  Adjunct Instructor of Family Medicine  [employer]

## 2018-01-07 ENCOUNTER — Encounter: Payer: Self-pay | Admitting: Osteopathic Medicine

## 2018-01-15 ENCOUNTER — Ambulatory Visit: Payer: BLUE CROSS/BLUE SHIELD | Admitting: Osteopathic Medicine

## 2018-01-15 ENCOUNTER — Encounter: Payer: Self-pay | Admitting: Osteopathic Medicine

## 2018-01-15 VITALS — BP 124/69 | HR 84 | Temp 98.1°F | Wt 183.1 lb

## 2018-01-15 DIAGNOSIS — Z716 Tobacco abuse counseling: Secondary | ICD-10-CM | POA: Diagnosis not present

## 2018-01-15 DIAGNOSIS — F17200 Nicotine dependence, unspecified, uncomplicated: Secondary | ICD-10-CM

## 2018-01-15 NOTE — Progress Notes (Signed)
HPI: Bryan Benson is a 67 y.o. male who  has a past medical history of Hypertension and Psoriatic arthritis (HCC).  he presents to [facility] today, 01/15/18,  for chief complaint of:  Follow-up recent visit w/ Dr T for breathing issues  Notes reviewed from recent visit with Dr. Karie Schwalbe on 01/02/2018. Was seen for 5 days of increasing cough, SOB, wheezing, muscle aches, fevers, chills. No chest pain, presyncope, rash, or GI symptoms. Was treated with Decadron, doxycycline. Chest x-ray demonstrated bilateral mild interstitial thickening, no focal opacity, no effusion/pneumothorax. No active cardiopulmonary disease with final impression on the CXR. Given smoking history and symptoms, Dr. Karie Schwalbe advised patient to return in 2 weeks for spirometry to confirm possible COPD diagnosis.  At baseline, patient states he has no problems with shortness of breath or performing activities of daily living to his satisfaction. He is still smoking, working on cutting back. No chronic cough. He states he does not get frequent respiratory infections. His main concern is that he doesn't feel he needs a bit workup for a disease which is not particularly bothering him.    Past medical history, surgical history, social history and family history reviewed. No updates needed.   Current medication list and allergy/intolerance information reviewed.    Current Outpatient Medications on File Prior to Visit  Medication Sig Dispense Refill  . amLODipine-benazepril (LOTREL) 5-10 MG capsule Take 1 capsule by mouth daily. 90 capsule 3  . aspirin 81 MG tablet Take 1 tablet (81 mg total) by mouth daily. 90 tablet 3  . azithromycin (ZITHROMAX Z-PAK) 250 MG tablet Take 2 tabs today; then begin one tab once daily for 4 more days. 6 tablet 0  . dexamethasone (DECADRON) 4 MG tablet Take 1 tablet (4 mg total) by mouth 3 (three) times daily. 15 tablet 0  . inFLIXimab (REMICADE) 100 MG injection Inject 450 mg  into the vein every 8 (eight) weeks.    . metoprolol succinate (TOPROL-XL) 100 MG 24 hr tablet Take 1 tablet (100 mg total) by mouth daily. Take with or immediately following a meal. 90 tablet 3  . Pitavastatin Calcium (LIVALO) 2 MG TABS Take 1 tablet (2 mg total) by mouth daily. 90 tablet 3   No current facility-administered medications on file prior to visit.    Allergies  Allergen Reactions  . Lipitor [Atorvastatin]   . Methotrexate Derivatives   . Penicillins   . Statins Other (See Comments)  . Zocor [Simvastatin]       Review of Systems:  Constitutional: No recent illness  HEENT: No  headache, no vision change  Cardiac: No  chest pain, No  pressure, No palpitations  Respiratory:  No  shortness of breath. No  Cough  Gastrointestinal: No  abdominal pain  Musculoskeletal: No new myalgia/arthralgia  Skin: No  Rash  Neurologic: No  weakness, No  Dizziness  Psychiatric: No  concerns with depression, No  concerns with anxiety  Exam:  BP 124/69   Pulse 84   Temp 98.1 F (36.7 C) (Oral)   Wt 183 lb 1.3 oz (83 kg)   BMI 24.83 kg/m   Constitutional: VS see above. General Appearance: alert, well-developed, well-nourished, NAD  Eyes: Normal lids and conjunctive, non-icteric sclera  Ears, Nose, Mouth, Throat: MMM, Normal external inspection ears/nares/mouth/lips/gums.  Neck: No masses, trachea midline.   Respiratory: Normal respiratory effort. Scattered coarse breath sounds and mild wheeze, no rhonchi, no rales  Cardiovascular: S1/S2 normal, no murmur, no rub/gallop auscultated.  RRR.   Musculoskeletal: Gait normal. Symmetric and independent movement of all extremities  Neurological: Normal balance/coordination. No tremor.  Skin: warm, dry, intact.   Psychiatric: Normal judgment/insight. Normal mood and affect. Oriented x3.   Immunization History  Administered Date(s) Administered  . Tdap 11/14/2007   Chest x-ray images personally  reviewed.   ASSESSMENT/PLAN:   Smoker  Tobacco abuse counseling   Discussion with the patient about there certainly being a possibility for COPD given his lung sounds and his smoking history, it is up to him whether or not to pursue diagnosis/treatment for this. For an asymptomatic person who does not want to be taking inhalers unless absolutely needed, I think it's okay to defer spirometry. It is certainly possible that he has COPD that we may be missing, and patient is aware of this risk in his decision to defer spirometry. Patient was educated that if he develops more symptoms such as shortness of breath/cough, or frequent respiratory infections, we should pursue the matter further.  Patient Instructions  If you decide you want the lung function testing, or if you're having more shortness of breath or cough, or frequent respiratory infection, we should evaluate formally for COPD.     Follow-up plan: Return for recheck at next scheduled visit, sooner if needed .  Visit summary with medication list and pertinent instructions was printed for patient to review, alert us if any changes needed. All questions at time of visit were answered - patient instructed to contact office with any additional concerns. ER/RTC precautions were reviewed with the patient and understanding verbalized.   Note: Total time spent 25 minutes, greater than 50% of the visit was spent face-to-face counseling and coordinating care for the following: The primary encounter diagnosis was Smoker. A diagnosis of Tobacco abuse counseling was also pertinent to this visit.Marland Kitchen.  Please note: voice recognition software was used to produce this document, and typos may escape review. Please contact Dr. Lyn HollingsheadAlexander for any needed clarifications.

## 2018-01-15 NOTE — Patient Instructions (Signed)
If you decide you want the lung function testing, or if you're having more shortness of breath or cough, or frequent respiratory infection, we should evaluate formally for COPD.

## 2018-01-16 ENCOUNTER — Other Ambulatory Visit: Payer: BLUE CROSS/BLUE SHIELD

## 2018-02-19 DIAGNOSIS — L4050 Arthropathic psoriasis, unspecified: Secondary | ICD-10-CM | POA: Diagnosis not present

## 2018-02-19 DIAGNOSIS — Z79899 Other long term (current) drug therapy: Secondary | ICD-10-CM | POA: Diagnosis not present

## 2018-02-21 DIAGNOSIS — H5203 Hypermetropia, bilateral: Secondary | ICD-10-CM | POA: Diagnosis not present

## 2018-02-21 DIAGNOSIS — H25813 Combined forms of age-related cataract, bilateral: Secondary | ICD-10-CM | POA: Diagnosis not present

## 2018-03-10 ENCOUNTER — Encounter: Payer: Self-pay | Admitting: Osteopathic Medicine

## 2018-03-11 ENCOUNTER — Telehealth: Payer: Self-pay | Admitting: Osteopathic Medicine

## 2018-03-11 NOTE — Telephone Encounter (Signed)
I have submitted the information to the insurance for the patient and awaiting determination.

## 2018-03-11 NOTE — Telephone Encounter (Signed)
Please call patient and let him know, we have submitted the information to the insurance and are awaiting their determination.  He should call us if he has not heard back within a week

## 2018-03-11 NOTE — Telephone Encounter (Signed)
See MyChart message re Livalo Rx  I sent:  "I'll pass this along to the person in our office who takes care of approvals and prior authorizations. She may be able to help [place] with this."  ===View-only below this line===   ----- Message -----    From: Bryan Benson    Sent: 03/10/2018  2:59 PM EDT      To: Sunnie Nielsen, DO Subject: Non-Urgent Medical Question  DR. Aleander,  I submitted paper work last Monday requesting that I needed an exemption for my medication of Levalo for medicare to cover this prescription. Could you please confirm that you submitted the request to Medicare as they only permitted a 30 day supply (which I already have) and the date you submitted the request. As they are supposed to respond within 24 hours of the exemption request. As always appreciate your help and attention in this matter.  Thanks  [facility]

## 2018-03-11 NOTE — Telephone Encounter (Signed)
Pt's wife has been updated.  

## 2018-03-12 ENCOUNTER — Encounter: Payer: Self-pay | Admitting: Osteopathic Medicine

## 2018-03-12 NOTE — Telephone Encounter (Signed)
As per Medicare Rep Cammie Mcgee medication has been approved from 03/12/18 - 03/13/19. Pt has been updated.

## 2018-04-15 DIAGNOSIS — Z79899 Other long term (current) drug therapy: Secondary | ICD-10-CM | POA: Diagnosis not present

## 2018-04-15 DIAGNOSIS — L4050 Arthropathic psoriasis, unspecified: Secondary | ICD-10-CM | POA: Diagnosis not present

## 2018-04-26 ENCOUNTER — Telehealth: Payer: Self-pay | Admitting: Osteopathic Medicine

## 2018-04-26 DIAGNOSIS — Z0000 Encounter for general adult medical examination without abnormal findings: Secondary | ICD-10-CM

## 2018-04-26 NOTE — Telephone Encounter (Signed)
Patient came to front desk requesting labs for annual physical be placed

## 2018-04-30 ENCOUNTER — Encounter: Payer: Self-pay | Admitting: Osteopathic Medicine

## 2018-04-30 ENCOUNTER — Ambulatory Visit (INDEPENDENT_AMBULATORY_CARE_PROVIDER_SITE_OTHER): Payer: Medicare Other | Admitting: Osteopathic Medicine

## 2018-04-30 VITALS — BP 128/75 | HR 87 | Temp 98.8°F | Wt 183.8 lb

## 2018-04-30 DIAGNOSIS — Z716 Tobacco abuse counseling: Secondary | ICD-10-CM | POA: Diagnosis not present

## 2018-04-30 DIAGNOSIS — I10 Essential (primary) hypertension: Secondary | ICD-10-CM

## 2018-04-30 DIAGNOSIS — L4050 Arthropathic psoriasis, unspecified: Secondary | ICD-10-CM

## 2018-04-30 DIAGNOSIS — E785 Hyperlipidemia, unspecified: Secondary | ICD-10-CM

## 2018-04-30 MED ORDER — METOPROLOL SUCCINATE ER 100 MG PO TB24: 100.0000 mg | ORAL_TABLET | Freq: Every day | ORAL | 3 refills | Status: AC

## 2018-04-30 MED ORDER — AMLODIPINE BESY-BENAZEPRIL HCL 5-10 MG PO CAPS: 1.0000 | ORAL_CAPSULE | Freq: Every day | ORAL | 3 refills | Status: AC

## 2018-04-30 MED ORDER — PITAVASTATIN CALCIUM 2 MG PO TABS: 2.0000 mg | ORAL_TABLET | Freq: Every day | ORAL | 3 refills | Status: AC

## 2018-04-30 NOTE — Progress Notes (Signed)
HPI: Bryan Benson is a 67 y.o. male who  has a past medical history of Hypertension and Psoriatic arthritis (HCC).  he presents to [facility] today, 04/30/18,  for chief complaint of:  BP follow-up, labs follow-up  Came to the office front desk requesting labs on Friday but he left the office before the orders could be placed and he could go directly to the lab. He will come back fasting.   HLD: on Livalo and tolerating this okay, hx stain intolerance.   HTN: no CP/SOB, no HA/VC/Dizzy  Smoker: last visit we discussed possible COPD testing, he reports no SOB or cough, no concern for recurrent/frequent respiratory infections     Past medical history, surgical history, and family history reviewed.  Current medication list and allergy/intolerance information reviewed.   (See remainder of HPI, ROS, Phys Exam below)  BP 128/75 (BP Location: Left Arm, Patient Position: Sitting, Cuff Size: Normal)   Pulse 87   Temp 98.8 F (37.1 C) (Oral)   Wt 183 lb 12.8 oz (83.4 kg)   BMI 24.93 kg/m    ASSESSMENT/PLAN: await labs  Essential hypertension - Plan: amLODipine-benazepril (LOTREL) 5-10 MG capsule, metoprolol succinate (TOPROL-XL) 100 MG 24 hr tablet  Hyperlipidemia, unspecified hyperlipidemia type - Plan: Pitavastatin Calcium (LIVALO) 2 MG TABS  Tobacco abuse counseling  Psoriatic arthritis (HCC) - continuing remicade w/ rheum   Meds ordered this encounter  Medications  . amLODipine-benazepril (LOTREL) 5-10 MG capsule    Sig: Take 1 capsule by mouth daily.    Dispense:  90 capsule    Refill:  3  . metoprolol succinate (TOPROL-XL) 100 MG 24 hr tablet    Sig: Take 1 tablet (100 mg total) by mouth daily. Take with or immediately following a meal.    Dispense:  90 tablet    Refill:  3  . Pitavastatin Calcium (LIVALO) 2 MG TABS    Sig: Take 1 tablet (2 mg total) by mouth daily.    Dispense:  90 tablet    Refill:  3     Follow-up plan:  Return in about 6 months (around 10/30/2018) for ANNUAL PHYSICAL / WELLNESS EXAM - see me sooner if needed.     ############################################ ############################################ ############################################ ############################################    Outpatient Encounter Medications as of 04/30/2018  Medication Sig Note  . amLODipine-benazepril (LOTREL) 5-10 MG capsule Take 1 capsule by mouth daily.   Marland Kitchen aspirin 81 MG tablet Take 1 tablet (81 mg total) by mouth daily.   Marland Kitchen inFLIXimab (REMICADE) 100 MG injection Inject 450 mg into the vein every 8 (eight) weeks. 07/31/2016: Received from: [facility] Received Sig: Infuse into the vein. EVERY 8 WEEKS  . metoprolol succinate (TOPROL-XL) 100 MG 24 hr tablet Take 1 tablet (100 mg total) by mouth daily. Take with or immediately following a meal.   . Pitavastatin Calcium (LIVALO) 2 MG TABS Take 1 tablet (2 mg total) by mouth daily.    No facility-administered encounter medications on file as of 04/30/2018.    Allergies  Allergen Reactions  . Lipitor [Atorvastatin]   . Methotrexate Derivatives   . Penicillins   . Simvastatin-High Dose   . Statins Other (See Comments)  . Zocor [Simvastatin]       Review of Systems:  Constitutional: No recent illness  HEENT: No  headache, no vision change  Cardiac: No  chest pain, No  pressure, No palpitations  Respiratory:  No  shortness of breath. No  Cough  Gastrointestinal: No  abdominal pain  Musculoskeletal: No new myalgia/arthralgia  Neurologic: No  weakness, No  Dizziness  Psychiatric: No  concerns with depression, No  concerns with anxiety  Exam:  BP 128/75 (BP Location: Left Arm, Patient Position: Sitting, Cuff Size: Normal)   Pulse 87   Temp 98.8 F (37.1 C) (Oral)   Wt 183 lb 12.8 oz (83.4 kg)   BMI 24.93 kg/m   Constitutional: VS see above. General Appearance: alert, well-developed, well-nourished,  NAD  Eyes: Normal lids and conjunctive, non-icteric sclera  Ears, Nose, Mouth, Throat: MMM, Normal external inspection ears/nares/mouth/lips/gums.  Neck: No masses, trachea midline.   Respiratory: Normal respiratory effort. no wheeze, no rhonchi, no rales  Cardiovascular: S1/S2 normal, no murmur, no rub/gallop auscultated. RRR.   Musculoskeletal: Gait normal. Symmetric and independent movement of all extremities  Neurological: Normal balance/coordination. No tremor.  Skin: warm, dry, intact.   Psychiatric: Normal judgment/insight. Normal mood and affect. Oriented x3.   Visit summary with medication list and pertinent instructions was printed for patient to review, advised to alert us if any changes needed. All questions at time of visit were answered - patient instructed to contact office with any additional concerns. ER/RTC precautions were reviewed with the patient and understanding verbalized.   Follow-up plan: Return in about 6 months (around 10/30/2018) for ANNUAL PHYSICAL / WELLNESS EXAM - see me sooner if needed.  Please note: voice recognition software was used to produce this document, and typos may escape review. Please contact Dr. Lyn HollingsheadAlexander for any needed clarifications.

## 2018-06-10 DIAGNOSIS — Z79899 Other long term (current) drug therapy: Secondary | ICD-10-CM | POA: Diagnosis not present

## 2018-06-10 DIAGNOSIS — L4050 Arthropathic psoriasis, unspecified: Secondary | ICD-10-CM | POA: Diagnosis not present

## 2018-07-15 ENCOUNTER — Emergency Department (INDEPENDENT_AMBULATORY_CARE_PROVIDER_SITE_OTHER): Payer: Medicare Other

## 2018-07-15 ENCOUNTER — Emergency Department (INDEPENDENT_AMBULATORY_CARE_PROVIDER_SITE_OTHER)
Admission: EM | Admit: 2018-07-15 | Discharge: 2018-07-15 | Disposition: A | Discharge status: Home / Self Care | Payer: Medicare Other | Source: Home / Self Care

## 2018-07-15 ENCOUNTER — Encounter: Payer: Self-pay | Admitting: Emergency Medicine

## 2018-07-15 DIAGNOSIS — S32019A Unspecified fracture of first lumbar vertebra, initial encounter for closed fracture: Secondary | ICD-10-CM

## 2018-07-15 DIAGNOSIS — S32010A Wedge compression fracture of first lumbar vertebra, initial encounter for closed fracture: Secondary | ICD-10-CM

## 2018-07-15 DIAGNOSIS — W109XXA Fall (on) (from) unspecified stairs and steps, initial encounter: Secondary | ICD-10-CM | POA: Diagnosis not present

## 2018-07-15 MED ORDER — ACETAMINOPHEN-CODEINE #3 300-30 MG PO TABS: 1.0000 | ORAL_TABLET | Freq: Four times a day (QID) | ORAL | 0 refills | Status: AC | PRN

## 2018-07-15 NOTE — Discharge Instructions (Addendum)
You may continue warm and cool compresses as tolerated. Avoid lifting heavy objects or performing any repetitive twisting movements. I have prescribed you Tylenol #3 to take every 6 hours as needed for pain.  Contact Dr.Thekkekandam office tomorrow to schedule an appointment.

## 2018-07-15 NOTE — ED Provider Notes (Signed)
[facility]    CSN: [id number] Arrival date & time: 07/15/18  1009     History   Chief Complaint Chief Complaint  Patient presents with  . Back Pain    HPI Bryan Benson is a 67 y.o. male.   HPI  Back Pain: Patient presents for presents evaluation of low back problems.  Symptoms have been present for 1 day and include localized T11-L1 region . Initial inciting event: sustained a fall over 1 week ago. Symptoms are worst: constat an characterized as aching. Alleviating factors identifiable by patient are rest and taking ibuprofen. Exacerbating factors identifiable by patient are :none identified . Treatments so far initiated by patient: rest and ibuprofen Previous lower back problems: none, positive history of psoriatic arthritis and receives Remicade infusion every 8 weeks . Current pain 2-4/10. Denies any numbness, tingling, of the lower extremities.  Injury has not impacted gait. Past Medical History:  Diagnosis Date  . Hypertension   . Psoriatic arthritis [facility])     Patient Active Problem List   Diagnosis Date Noted  . Shortness of breath 01/02/2018  . Smoker 01/02/2018  . Facial lesion 05/11/2015  . Gout 03/04/2015  . Microalbuminuria 03/04/2015  . Essential hypertension 02/08/2015  . Hyperlipidemia 02/08/2015  . Psoriatic arthritis (HCC) 02/08/2015    Past Surgical History:  Procedure Laterality Date  . APPENDECTOMY     age 37  . HERNIA REPAIR  [id number]  . TOTAL HIP ARTHROPLASTY  2005,2006       Home Medications    Prior to Admission medications   Medication Sig Start Date End Date Taking? Authorizing Provider  amLODipine-benazepril (LOTREL) 5-10 MG capsule Take 1 capsule by mouth daily. 04/30/18   Sunnie Nielsen, DO  aspirin 81 MG tablet Take 1 tablet (81 mg total) by mouth daily. 10/30/17   Sunnie Nielsen, DO  inFLIXimab (REMICADE) 100 MG injection Inject 450 mg into the vein every 8 (eight) weeks.    [provider]    metoprolol succinate (TOPROL-XL) 100 MG 24 hr tablet Take 1 tablet (100 mg total) by mouth daily. Take with or immediately following a meal. 04/30/18   Sunnie Nielsen, DO  Pitavastatin Calcium (LIVALO) 2 MG TABS Take 1 tablet (2 mg total) by mouth daily. 04/30/18   Sunnie Nielsen, DO    Family History Family History  Problem Relation Age of Onset  . Stroke Father   . Ovarian cysts Daughter     Social History Social History   Tobacco Use  . Smoking status: Current Every Day Smoker    Packs/day: 1.50    Years: 50.00    Pack years: 75.00  . Smokeless tobacco: Never Used  Substance Use Topics  . Alcohol use: Yes    Comment: 1-3 drink per night (wine or hard liquor)   . Drug use: No     Allergies   Lipitor [atorvastatin]; Methotrexate derivatives; Penicillins; Simvastatin-high dose; Statins; and Zocor [simvastatin]   Review of Systems Review of Systems Pertinent negatives listed in HPI  Physical Exam Triage Vital Signs ED Triage Vitals  Enc Vitals Group     BP      Pulse      Resp      Temp      Temp src      SpO2      Weight      Height      Head Circumference      Peak Flow      Pain Score  Pain Loc      Pain Edu?      Excl. in GC?    No data found.  Updated Vital Signs BP 132/83 (BP Location: Right Arm)   Pulse 68   Temp 98.6 F (37 C) (Oral)   Wt 184 lb (83.5 kg)   SpO2 98%   BMI 24.95 kg/m   Visual Acuity Right Eye Distance:   Left Eye Distance:   Bilateral Distance:    Right Eye Near:   Left Eye Near:    Bilateral Near:     Physical Exam  Constitutional: He appears well-developed and well-nourished. No distress.  Neck: Full passive range of motion without pain.  Cardiovascular: Normal rate.  Pulmonary/Chest: Breath sounds normal. No respiratory distress.  Musculoskeletal:       Thoracic back: He exhibits tenderness and pain. He exhibits normal range of motion, no bony tenderness, no swelling, no edema, no spasm and normal  pulse.  Neurological: Coordination normal.  Gait intact    UC Treatments / Results  Labs (all labs ordered are listed, but only abnormal results are displayed) Labs Reviewed - No data to display  EKG None  Radiology Dg Lumbar Spine 2-3 Views  Result Date: 07/15/2018 CLINICAL DATA:  67 year-old male missed a step and fell backwards landing on his back x 1 week ago. C/O bilateral lower back pain around L4 area EXAM: LUMBAR SPINE - 2-3 VIEW COMPARISON:  Chest x-ray on 01/02/2018 FINDINGS: There is a superior endplate fracture of L1 associated with sclerosis. There is anterior loss of height by approximately 30%. The fracture is new since February and is of indeterminate age. No retropulsion. There is facet hypertrophy in the LOWER lumbar levels. The patient has had bilateral hip arthroplasty. There is extensive atherosclerotic calcification of the abdominal aorta. IMPRESSION: 1. Anterior compression fracture of L1, subacute or acute. 2. Facet hypertrophy in the LOWER lumbar levels. Electronically Signed   By: Norva Pavlov M.D.   On: 07/15/2018 12:09    Procedures Procedures (including critical care time)  Medications Ordered in UC Medications - No data to display  Initial Impression / Assessment and Plan / UC Course  I have reviewed the triage vital signs and the nursing notes.  Pertinent labs & imaging results that were available during my care of the patient were reviewed by me and considered in my medical decision making (see chart for details).  Patient presents today with 1 week history of acute back pain persisting after a fall at home. His gait is stable and he is negative of any concerning neurological symptoms. Pain is localized to bilateral thoracic/lumbar spine region of T11-L1.Imaging significant for "anterior compression fracture of L1, subacute or acute". Consulted with Dr. Briant Sites who recommended conservative pain management, rest, and referral to sports medicine for  further evaluation. Patient verbalized agreement with the plan. He is in no distress and is able to ambulate independently with even stable gait. Prescribed a conservative amount of tylenol # 3  PRN, 1-2 tablets, every  six hours as needed.  -The patient was given clear instructions to go to ER or return to medical center if symptoms do not improve, worsen or new problems develop. The patient verbalized understanding.  Final Clinical Impressions(s) / UC Diagnoses   Final diagnoses:  Acute bilateral thoracic back pain     Discharge Instructions     You may continue warm and cool compresses as tolerated. Avoid lifting heavy objects or performing any repetitive twisting movements. I  have prescribed you Tylenol #3 to take every 6 hours as needed for pain.  Contact Dr.Thekkekandam office tomorrow to schedule an appointment.    ED Prescriptions    Medication Sig Dispense Auth. Provider   acetaminophen-codeine (TYLENOL #3) 300-30 MG tablet Take 1-2 tablets by mouth every 6 (six) hours as needed for moderate pain. 18 tablet Bing Neighbors, FNP     Controlled Substance Prescriptions [place] Controlled Substance Registry consulted? Not Applicable   Bing Neighbors, FNP 07/16/18 2151

## 2018-07-15 NOTE — ED Triage Notes (Signed)
Pt c/o low back pain x1 week ago.

## 2018-07-18 ENCOUNTER — Ambulatory Visit (INDEPENDENT_AMBULATORY_CARE_PROVIDER_SITE_OTHER): Payer: Medicare Other | Admitting: Osteopathic Medicine

## 2018-07-18 ENCOUNTER — Encounter: Payer: Self-pay | Admitting: Osteopathic Medicine

## 2018-07-18 VITALS — BP 131/72 | HR 68 | Temp 98.3°F | Wt 185.4 lb

## 2018-07-18 DIAGNOSIS — E871 Hypo-osmolality and hyponatremia: Secondary | ICD-10-CM | POA: Diagnosis not present

## 2018-07-18 DIAGNOSIS — S32010D Wedge compression fracture of first lumbar vertebra, subsequent encounter for fracture with routine healing: Secondary | ICD-10-CM | POA: Diagnosis not present

## 2018-07-18 DIAGNOSIS — M899 Disorder of bone, unspecified: Secondary | ICD-10-CM | POA: Diagnosis not present

## 2018-07-18 LAB — COMPLETE METABOLIC PANEL WITH GFR
AG Ratio: 1.2 (calc) (ref 1.0–2.5)
ALBUMIN MSPROF: 4.2 g/dL (ref 3.6–5.1)
ALKALINE PHOSPHATASE (APISO): 53 U/L (ref 40–115)
ALT: 22 U/L (ref 9–46)
AST: 39 U/L — AB (ref 10–35)
BILIRUBIN TOTAL: 0.7 mg/dL (ref 0.2–1.2)
BUN: 8 mg/dL (ref 7–25)
CHLORIDE: 89 mmol/L — AB (ref 98–110)
CO2: 26 mmol/L (ref 20–32)
Calcium: 9.9 mg/dL (ref 8.6–10.3)
Creat: 0.76 mg/dL (ref 0.70–1.25)
GFR, Est African American: 111 mL/min/{1.73_m2} (ref >=60)
GFR, Est Non African American: 96 mL/min/{1.73_m2} (ref >=60)
GLUCOSE: 93 mg/dL (ref 65–99)
Globulin: 3.4 g/dL (calc) (ref 1.9–3.7)
Potassium: 4.1 mmol/L (ref 3.5–5.3)
SODIUM: 126 mmol/L — AB (ref 135–146)
Total Protein: 7.6 g/dL (ref 6.1–8.1)

## 2018-07-18 MED ORDER — ASPIRIN 81 MG PO TABS: 81.0000 mg | ORAL_TABLET | Freq: Every day | ORAL | 3 refills | Status: DC

## 2018-07-18 MED ORDER — ASPIRIN 81 MG PO TABS: 81.0000 mg | ORAL_TABLET | Freq: Every day | ORAL | 3 refills | Status: AC

## 2018-07-18 NOTE — Patient Instructions (Addendum)
   Will get bone density test  Will check calcium and vitmain D levels  Recommend calcium daily 1300-1500 mg  Recommend Vitamin D daily 1000-2000 units  Will probably end up adding a prescription to help prevent future fracture

## 2018-07-18 NOTE — Progress Notes (Addendum)
HPI: Bryan Benson is a 67 y.o. male who  has a past medical history of Hypertension and Psoriatic arthritis (HCC).  he presents to [facility] today, 07/18/18,  for chief complaint of:  Follow-up from urgent care for compression fracture  Urgent care notes reviewed, recent fall backwards onto his buttocks, subsequent back pain.  X-ray was positive for L1 compression fracture.  No history of osteoporosis.  Patient is a smoker.  Pain is fairly well controlled today but he has some questions about further management of this issue.  Patient and his wife will actually be relocating to [place] within the next couple of months to be closer to family.  Patient is accompanied by wife who assists with history-taking.   ADDENDUM: hyponatremia on labs, see below for results     Past medical history, surgical history, and family history reviewed.  Current medication list and allergy/intolerance information reviewed.   (See remainder of HPI, ROS, Phys Exam below)  Dg Lumbar Spine 2-3 Views  Result Date: 07/15/2018 CLINICAL DATA:  67 year-old male missed a step and fell backwards landing on his back x 1 week ago. C/O bilateral lower back pain around L4 area EXAM: LUMBAR SPINE - 2-3 VIEW COMPARISON:  Chest x-ray on 01/02/2018 FINDINGS: There is a superior endplate fracture of L1 associated with sclerosis. There is anterior loss of height by approximately 30%. The fracture is new since February and is of indeterminate age. No retropulsion. There is facet hypertrophy in the LOWER lumbar levels. The patient has had bilateral hip arthroplasty. There is extensive atherosclerotic calcification of the abdominal aorta. IMPRESSION: 1. Anterior compression fracture of L1, subacute or acute. 2. Facet hypertrophy in the LOWER lumbar levels. Electronically Signed   By: Norva Pavlov M.D.   On: 07/15/2018 12:09     Recent Results (from the past 2160 hour(s))  COMPLETE  METABOLIC PANEL WITH GFR     Status: Abnormal   Collection Time: 07/18/18  9:52 AM  Result Value Ref Range   Glucose, Bld 93 65 - 99 mg/dL    Comment: .            Fasting reference interval .    BUN 8 7 - 25 mg/dL   Creat 1.61 0.96 - 0.45 mg/dL    Comment: For patients >47 years of age, the reference limit for Creatinine is approximately 13% higher for people identified as African-American. .    GFR, Est Non African American 96 > OR = 60 mL/min/1.82m2   GFR, Est African American 111 > OR = 60 mL/min/1.52m2   BUN/Creatinine Ratio NOT APPLICABLE 6 - 22 (calc)   Sodium 126 (L) 135 - 146 mmol/L   Potassium 4.1 3.5 - 5.3 mmol/L   Chloride 89 (L) 98 - 110 mmol/L   CO2 26 20 - 32 mmol/L   Calcium 9.9 8.6 - 10.3 mg/dL   Total Protein 7.6 6.1 - 8.1 g/dL   Albumin 4.2 3.6 - 5.1 g/dL   Globulin 3.4 1.9 - 3.7 g/dL (calc)   AG Ratio 1.2 1.0 - 2.5 (calc)   Total Bilirubin 0.7 0.2 - 1.2 mg/dL   Alkaline phosphatase (APISO) 53 40 - 115 U/L   AST 39 (H) 10 - 35 U/L   ALT 22 9 - 46 U/L  Basic metabolic panel     Status: Abnormal   Collection Time: 07/19/18 10:49 AM  Result Value Ref Range   Glucose, Bld 90 65 - 99 mg/dL  Comment: .            Fasting reference interval .    BUN 6 (L) 7 - 25 mg/dL   Creat 8.12 7.51 - 7.00 mg/dL    Comment: For patients >50 years of age, the reference limit for Creatinine is approximately 13% higher for people identified as African-American. .    BUN/Creatinine Ratio 8 6 - 22 (calc)   Sodium 127 (L) 135 - 146 mmol/L   Potassium 4.2 3.5 - 5.3 mmol/L   Chloride 90 (L) 98 - 110 mmol/L   CO2 26 20 - 32 mmol/L   Calcium 10.1 8.6 - 10.3 mg/dL  Osmolality     Status: Abnormal   Collection Time: 07/19/18 10:49 AM  Result Value Ref Range   Osmolality 266 (L) 278 - 305 mOsm/kg  Osmolality, urine     Status: None   Collection Time: 07/19/18 10:49 AM  Result Value Ref Range   Osmolality, Ur 558 50 - 1,200 mOsm/kg  Sodium, urine, random     Status: None    Collection Time: 07/19/18 10:49 AM  Result Value Ref Range   Sodium, Ur 85 28 - 272 mmol/L  TSH     Status: None   Collection Time: 07/19/18 10:49 AM  Result Value Ref Range   TSH 1.09 0.40 - 4.50 mIU/L     ASSESSMENT/PLAN:   Closed compression fracture of L1 lumbar vertebra with routine healing, subsequent encounter - Plan: DG Bone Density, COMPLETE METABOLIC PANEL WITH GFR, VITAMIN D 25 Hydroxy (Vit-D Deficiency, Fractures)  Bone disorder  Hyponatremia - Plan: Basic metabolic panel, Osmolality, Osmolality, urine, Sodium, urine, random, TSH, Ambulatory referral to Endocrinology   Patient Instructions  Will get bone density test Will check calcium and vitmain D levels Recommend calcium daily 1300-1500 mg Recommend Vitamin D daily 1000-2000 units Will probably end up adding a prescription to help prevent future fracture     Follow-up plan: Return for recheck as needed .                      ############################################ ############################################ ############################################ ############################################    Outpatient Encounter Medications as of 07/18/2018  Medication Sig Note  . amLODipine-benazepril (LOTREL) 5-10 MG capsule Take 1 capsule by mouth daily.   Marland Kitchen aspirin 81 MG tablet Take 1 tablet (81 mg total) by mouth daily.   Marland Kitchen inFLIXimab (REMICADE) 100 MG injection Inject 450 mg into the vein every 8 (eight) weeks. 07/31/2016: Received from: [facility] Received Sig: Infuse into the vein. EVERY 8 WEEKS  . metoprolol succinate (TOPROL-XL) 100 MG 24 hr tablet Take 1 tablet (100 mg total) by mouth daily. Take with or immediately following a meal.   . Pitavastatin Calcium (LIVALO) 2 MG TABS Take 1 tablet (2 mg total) by mouth daily.   Marland Kitchen acetaminophen-codeine (TYLENOL #3) 300-30 MG tablet Take 1-2 tablets by mouth every 6 (six) hours as needed for moderate pain. (Patient  not taking: Reported on 07/18/2018) 07/18/2018: As per pt, did not pick up medication from pharmacy   No facility-administered encounter medications on file as of 07/18/2018.    Allergies  Allergen Reactions  . Methotrexate     Chills, sweats, muscle cramps  . Lipitor [Atorvastatin]   . Methotrexate Derivatives   . Penicillins   . Simvastatin-High Dose   . Statins Other (See Comments)  . Zocor [Simvastatin]       Review of Systems:  Constitutional: No recent illness  Cardiac: No  chest pain, No  pressure, No palpitations  Respiratory:  No  shortness of breath. No  Cough  Gastrointestinal: No  abdominal pain, no change on bowel habits  Musculoskeletal: +new myalgia/arthralgia  Skin: No  Rash  Neurologic: No  weakness, No  Dizziness   Exam:  BP 131/72 (BP Location: Left Arm, Patient Position: Sitting, Cuff Size: Normal)   Pulse 68   Temp 98.3 F (36.8 C) (Oral)   Wt 185 lb 6.4 oz (84.1 kg)   BMI 25.14 kg/m   Constitutional: VS see above. General Appearance: alert, well-developed, well-nourished, NAD  Eyes: Normal lids and conjunctive, non-icteric sclera  Ears, Nose, Mouth, Throat: MMM, Normal external inspection ears/nares/mouth/lips/gums.  Neck: No masses, trachea midline.   Respiratory: Normal respiratory effort. no wheeze, no rhonchi, no rales  Cardiovascular: S1/S2 normal, no murmur, no rub/gallop auscultated. RRR.   Musculoskeletal: Gait normal. Symmetric and independent movement of all extremities.  Paraspinal tenderness in the thoracolumbar area, no midline tenderness  Neurological: Normal balance/coordination. No tremor.  Skin: warm, dry, intact.   Psychiatric: Normal judgment/insight. Normal mood and affect. Oriented x3.   Visit summary with medication list and pertinent instructions was printed for patient to review, advised to alert [place] if any changes needed. All questions at time of visit were answered - patient instructed to contact office with any  additional concerns. ER/RTC precautions were reviewed with the patient and understanding verbalized.   Follow-up plan: Return for recheck as needed .  Note: Total time spent 25 minutes, greater than 50% of the visit was spent face-to-face counseling and coordinating care for the following: The encounter diagnosis was Closed compression fracture of L1 lumbar vertebra with routine healing, subsequent encounter..  Please note: voice recognition software was used to produce this document, and typos may escape review. Please contact Dr. Lyn Hollingshead for any needed clarifications.

## 2018-07-19 DIAGNOSIS — E871 Hypo-osmolality and hyponatremia: Secondary | ICD-10-CM | POA: Diagnosis not present

## 2018-07-19 NOTE — Addendum Note (Signed)
Addended by: Deirdre Pippins on: 07/19/2018 08:36 AM   Modules accepted: Orders

## 2018-07-22 LAB — BASIC METABOLIC PANEL
BUN / CREAT RATIO: 8 (calc) (ref 6–22)
BUN: 6 mg/dL — AB (ref 7–25)
CO2: 26 mmol/L (ref 20–32)
CREATININE: 0.71 mg/dL (ref 0.70–1.25)
Calcium: 10.1 mg/dL (ref 8.6–10.3)
Chloride: 90 mmol/L — ABNORMAL LOW (ref 98–110)
GLUCOSE: 90 mg/dL (ref 65–99)
Potassium: 4.2 mmol/L (ref 3.5–5.3)
Sodium: 127 mmol/L — ABNORMAL LOW (ref 135–146)

## 2018-07-22 LAB — TSH: TSH: 1.09 m[IU]/L (ref 0.40–4.50)

## 2018-07-22 LAB — SODIUM, URINE, RANDOM: Sodium, Ur: 85 mmol/L (ref 28–272)

## 2018-07-22 LAB — OSMOLALITY: Osmolality: 266 mOsm/kg — ABNORMAL LOW (ref 278–305)

## 2018-07-22 LAB — OSMOLALITY, URINE: Osmolality, Ur: 558 mOsm/kg (ref 50–1200)

## 2018-07-24 ENCOUNTER — Encounter: Payer: Self-pay | Admitting: Osteopathic Medicine

## 2018-07-24 ENCOUNTER — Ambulatory Visit (INDEPENDENT_AMBULATORY_CARE_PROVIDER_SITE_OTHER): Payer: Medicare Other

## 2018-07-24 DIAGNOSIS — Z13820 Encounter for screening for osteoporosis: Secondary | ICD-10-CM

## 2018-07-24 MED ORDER — ALENDRONATE SODIUM 70 MG PO TABS: 70.0000 mg | ORAL_TABLET | ORAL | 3 refills | Status: AC

## 2018-07-24 NOTE — Addendum Note (Signed)
Addended by: Deirdre Pippins on: 07/24/2018 10:53 AM   Modules accepted: Orders

## 2018-07-25 DIAGNOSIS — L4050 Arthropathic psoriasis, unspecified: Secondary | ICD-10-CM | POA: Diagnosis not present

## 2018-07-25 DIAGNOSIS — Z79899 Other long term (current) drug therapy: Secondary | ICD-10-CM | POA: Diagnosis not present

## 2018-07-25 DIAGNOSIS — T148XXA Other injury of unspecified body region, initial encounter: Secondary | ICD-10-CM | POA: Diagnosis not present

## 2018-08-05 DIAGNOSIS — L4050 Arthropathic psoriasis, unspecified: Secondary | ICD-10-CM | POA: Diagnosis not present

## 2018-08-05 DIAGNOSIS — Z79899 Other long term (current) drug therapy: Secondary | ICD-10-CM | POA: Diagnosis not present

## 2018-08-06 ENCOUNTER — Encounter: Payer: Self-pay | Admitting: Osteopathic Medicine

## 2018-08-06 DIAGNOSIS — S32010D Wedge compression fracture of first lumbar vertebra, subsequent encounter for fracture with routine healing: Secondary | ICD-10-CM

## 2018-08-07 MED ORDER — TRAMADOL HCL 50 MG PO TABS: 50.0000 mg | ORAL_TABLET | Freq: Four times a day (QID) | ORAL | 0 refills | Status: AC | PRN

## 2018-08-16 ENCOUNTER — Encounter: Payer: Self-pay | Admitting: Osteopathic Medicine

## 2018-10-21 ENCOUNTER — Encounter: Payer: Self-pay | Admitting: Osteopathic Medicine

## 2018-10-25 DIAGNOSIS — H2513 Age-related nuclear cataract, bilateral: Secondary | ICD-10-CM | POA: Diagnosis not present

## 2018-10-30 ENCOUNTER — Encounter: Payer: BLUE CROSS/BLUE SHIELD | Admitting: Osteopathic Medicine

## 2018-11-25 DIAGNOSIS — Z6825 Body mass index (BMI) 25.0-25.9, adult: Secondary | ICD-10-CM | POA: Diagnosis not present

## 2018-11-25 DIAGNOSIS — M810 Age-related osteoporosis without current pathological fracture: Secondary | ICD-10-CM | POA: Diagnosis not present

## 2018-11-25 DIAGNOSIS — Z0130 Encounter for examination of blood pressure without abnormal findings: Secondary | ICD-10-CM | POA: Diagnosis not present

## 2018-11-25 DIAGNOSIS — L4050 Arthropathic psoriasis, unspecified: Secondary | ICD-10-CM | POA: Diagnosis not present

## 2018-11-25 DIAGNOSIS — L409 Psoriasis, unspecified: Secondary | ICD-10-CM | POA: Diagnosis not present

## 2018-11-27 DIAGNOSIS — I10 Essential (primary) hypertension: Secondary | ICD-10-CM | POA: Diagnosis not present

## 2018-11-27 DIAGNOSIS — S32009A Unspecified fracture of unspecified lumbar vertebra, initial encounter for closed fracture: Secondary | ICD-10-CM | POA: Diagnosis not present

## 2018-11-27 DIAGNOSIS — K5732 Diverticulitis of large intestine without perforation or abscess without bleeding: Secondary | ICD-10-CM | POA: Diagnosis not present

## 2018-11-27 DIAGNOSIS — R109 Unspecified abdominal pain: Secondary | ICD-10-CM | POA: Diagnosis not present

## 2018-11-27 DIAGNOSIS — M1990 Unspecified osteoarthritis, unspecified site: Secondary | ICD-10-CM | POA: Diagnosis not present

## 2018-11-27 DIAGNOSIS — S0292XA Unspecified fracture of facial bones, initial encounter for closed fracture: Secondary | ICD-10-CM | POA: Diagnosis not present

## 2018-11-27 DIAGNOSIS — Z800 Family history of malignant neoplasm of digestive organs: Secondary | ICD-10-CM | POA: Diagnosis not present

## 2018-11-27 DIAGNOSIS — S0993XA Unspecified injury of face, initial encounter: Secondary | ICD-10-CM | POA: Diagnosis not present

## 2018-11-27 DIAGNOSIS — I70201 Unspecified atherosclerosis of native arteries of extremities, right leg: Secondary | ICD-10-CM | POA: Diagnosis not present

## 2018-11-27 DIAGNOSIS — Z79899 Other long term (current) drug therapy: Secondary | ICD-10-CM | POA: Diagnosis not present

## 2018-11-27 DIAGNOSIS — R079 Chest pain, unspecified: Secondary | ICD-10-CM | POA: Diagnosis not present

## 2018-11-27 DIAGNOSIS — E785 Hyperlipidemia, unspecified: Secondary | ICD-10-CM | POA: Diagnosis not present

## 2018-11-27 DIAGNOSIS — F17210 Nicotine dependence, cigarettes, uncomplicated: Secondary | ICD-10-CM | POA: Diagnosis not present

## 2018-11-27 DIAGNOSIS — Z880 Allergy status to penicillin: Secondary | ICD-10-CM | POA: Diagnosis not present

## 2018-11-27 DIAGNOSIS — S3991XA Unspecified injury of abdomen, initial encounter: Secondary | ICD-10-CM | POA: Diagnosis not present

## 2018-11-27 DIAGNOSIS — S199XXA Unspecified injury of neck, initial encounter: Secondary | ICD-10-CM | POA: Diagnosis not present

## 2018-11-27 DIAGNOSIS — S022XXA Fracture of nasal bones, initial encounter for closed fracture: Secondary | ICD-10-CM | POA: Diagnosis not present

## 2018-11-27 DIAGNOSIS — S32010A Wedge compression fracture of first lumbar vertebra, initial encounter for closed fracture: Secondary | ICD-10-CM | POA: Diagnosis not present

## 2018-11-27 DIAGNOSIS — M818 Other osteoporosis without current pathological fracture: Secondary | ICD-10-CM | POA: Diagnosis not present

## 2018-11-27 DIAGNOSIS — Z888 Allergy status to other drugs, medicaments and biological substances status: Secondary | ICD-10-CM | POA: Diagnosis not present

## 2018-11-27 DIAGNOSIS — W19XXXA Unspecified fall, initial encounter: Secondary | ICD-10-CM | POA: Diagnosis not present

## 2018-11-27 DIAGNOSIS — Z7982 Long term (current) use of aspirin: Secondary | ICD-10-CM | POA: Diagnosis not present

## 2018-11-27 DIAGNOSIS — S2241XA Multiple fractures of ribs, right side, initial encounter for closed fracture: Secondary | ICD-10-CM | POA: Diagnosis not present

## 2018-11-27 DIAGNOSIS — R911 Solitary pulmonary nodule: Secondary | ICD-10-CM | POA: Diagnosis not present

## 2018-11-27 DIAGNOSIS — Z8249 Family history of ischemic heart disease and other diseases of the circulatory system: Secondary | ICD-10-CM | POA: Diagnosis not present

## 2018-11-27 DIAGNOSIS — S299XXA Unspecified injury of thorax, initial encounter: Secondary | ICD-10-CM | POA: Diagnosis not present

## 2018-11-27 DIAGNOSIS — S0990XA Unspecified injury of head, initial encounter: Secondary | ICD-10-CM | POA: Diagnosis not present

## 2018-11-27 DIAGNOSIS — S0231XA Fracture of orbital floor, right side, initial encounter for closed fracture: Secondary | ICD-10-CM | POA: Diagnosis not present

## 2018-11-27 DIAGNOSIS — L4050 Arthropathic psoriasis, unspecified: Secondary | ICD-10-CM | POA: Diagnosis not present

## 2018-11-27 DIAGNOSIS — S0240CA Maxillary fracture, right side, initial encounter for closed fracture: Secondary | ICD-10-CM | POA: Diagnosis not present

## 2018-12-07 DIAGNOSIS — L4050 Arthropathic psoriasis, unspecified: Secondary | ICD-10-CM | POA: Diagnosis not present

## 2018-12-07 DIAGNOSIS — Z79899 Other long term (current) drug therapy: Secondary | ICD-10-CM | POA: Diagnosis not present

## 2018-12-17 DIAGNOSIS — L4050 Arthropathic psoriasis, unspecified: Secondary | ICD-10-CM | POA: Diagnosis not present

## 2018-12-19 DIAGNOSIS — Z135 Encounter for screening for eye and ear disorders: Secondary | ICD-10-CM | POA: Diagnosis not present

## 2018-12-19 DIAGNOSIS — H04123 Dry eye syndrome of bilateral lacrimal glands: Secondary | ICD-10-CM | POA: Diagnosis not present

## 2018-12-19 DIAGNOSIS — H25813 Combined forms of age-related cataract, bilateral: Secondary | ICD-10-CM | POA: Diagnosis not present

## 2018-12-19 DIAGNOSIS — H04122 Dry eye syndrome of left lacrimal gland: Secondary | ICD-10-CM | POA: Diagnosis not present

## 2018-12-19 DIAGNOSIS — H04121 Dry eye syndrome of right lacrimal gland: Secondary | ICD-10-CM | POA: Diagnosis not present

## 2018-12-30 DIAGNOSIS — L4050 Arthropathic psoriasis, unspecified: Secondary | ICD-10-CM | POA: Diagnosis not present

## 2018-12-30 DIAGNOSIS — H269 Unspecified cataract: Secondary | ICD-10-CM | POA: Diagnosis not present

## 2018-12-30 DIAGNOSIS — Z6824 Body mass index (BMI) 24.0-24.9, adult: Secondary | ICD-10-CM | POA: Diagnosis not present

## 2018-12-30 DIAGNOSIS — M810 Age-related osteoporosis without current pathological fracture: Secondary | ICD-10-CM | POA: Diagnosis not present

## 2018-12-30 DIAGNOSIS — Z0130 Encounter for examination of blood pressure without abnormal findings: Secondary | ICD-10-CM | POA: Diagnosis not present

## 2018-12-30 DIAGNOSIS — Z01818 Encounter for other preprocedural examination: Secondary | ICD-10-CM | POA: Diagnosis not present

## 2019-01-13 DIAGNOSIS — H2511 Age-related nuclear cataract, right eye: Secondary | ICD-10-CM | POA: Diagnosis not present

## 2019-01-13 DIAGNOSIS — H5213 Myopia, bilateral: Secondary | ICD-10-CM | POA: Diagnosis not present

## 2019-01-13 DIAGNOSIS — H25811 Combined forms of age-related cataract, right eye: Secondary | ICD-10-CM | POA: Diagnosis not present

## 2019-01-13 DIAGNOSIS — F17210 Nicotine dependence, cigarettes, uncomplicated: Secondary | ICD-10-CM | POA: Diagnosis not present

## 2019-01-27 DIAGNOSIS — H25812 Combined forms of age-related cataract, left eye: Secondary | ICD-10-CM | POA: Diagnosis not present

## 2019-01-27 DIAGNOSIS — F17210 Nicotine dependence, cigarettes, uncomplicated: Secondary | ICD-10-CM | POA: Diagnosis not present

## 2019-02-07 DIAGNOSIS — Z01818 Encounter for other preprocedural examination: Secondary | ICD-10-CM | POA: Diagnosis not present

## 2019-02-11 DIAGNOSIS — L4050 Arthropathic psoriasis, unspecified: Secondary | ICD-10-CM | POA: Diagnosis not present

## 2019-02-24 DIAGNOSIS — Z87891 Personal history of nicotine dependence: Secondary | ICD-10-CM | POA: Diagnosis not present

## 2019-02-24 DIAGNOSIS — Z888 Allergy status to other drugs, medicaments and biological substances status: Secondary | ICD-10-CM | POA: Diagnosis not present

## 2019-02-24 DIAGNOSIS — R911 Solitary pulmonary nodule: Secondary | ICD-10-CM | POA: Diagnosis not present

## 2019-02-24 DIAGNOSIS — Z880 Allergy status to penicillin: Secondary | ICD-10-CM | POA: Diagnosis not present

## 2021-03-13 DEATH — deceased
